# Patient Record
Sex: Male | Born: 1975 | Race: White | Hispanic: No | Marital: Married | Smoking: Former smoker
Health system: Southern US, Community
[De-identification: ages and names within clinical notes are randomized; demographics above are authoritative.]

## PROBLEM LIST (undated history)

## (undated) DIAGNOSIS — T7840XA Allergy, unspecified, initial encounter: Secondary | ICD-10-CM

## (undated) DIAGNOSIS — K219 Gastro-esophageal reflux disease without esophagitis: Secondary | ICD-10-CM

## (undated) DIAGNOSIS — I209 Angina pectoris, unspecified: Secondary | ICD-10-CM

## (undated) DIAGNOSIS — E079 Disorder of thyroid, unspecified: Secondary | ICD-10-CM

## (undated) DIAGNOSIS — E785 Hyperlipidemia, unspecified: Secondary | ICD-10-CM

## (undated) DIAGNOSIS — J302 Other seasonal allergic rhinitis: Secondary | ICD-10-CM

## (undated) DIAGNOSIS — C801 Malignant (primary) neoplasm, unspecified: Secondary | ICD-10-CM

## (undated) DIAGNOSIS — I10 Essential (primary) hypertension: Secondary | ICD-10-CM

## (undated) HISTORY — DX: Hyperlipidemia, unspecified: E78.5

## (undated) HISTORY — DX: Disorder of thyroid, unspecified: E07.9

## (undated) HISTORY — PX: TONSILLECTOMY: SUR1361

## (undated) HISTORY — DX: Allergy, unspecified, initial encounter: T78.40XA

## (undated) HISTORY — DX: Gastro-esophageal reflux disease without esophagitis: K21.9

## (undated) HISTORY — DX: Malignant (primary) neoplasm, unspecified: C80.1

---

## 2010-09-08 ENCOUNTER — Emergency Department (INDEPENDENT_AMBULATORY_CARE_PROVIDER_SITE_OTHER): Payer: 59

## 2010-09-08 ENCOUNTER — Emergency Department (HOSPITAL_BASED_OUTPATIENT_CLINIC_OR_DEPARTMENT_OTHER)
Admission: EM | Admit: 2010-09-08 | Discharge: 2010-09-08 | Disposition: A | Discharge status: Home / Self Care | Payer: 59 | Attending: Emergency Medicine | Admitting: Emergency Medicine

## 2010-09-08 ENCOUNTER — Encounter: Payer: Self-pay | Admitting: *Deleted

## 2010-09-08 DIAGNOSIS — R0789 Other chest pain: Secondary | ICD-10-CM

## 2010-09-08 DIAGNOSIS — X58XXXA Exposure to other specified factors, initial encounter: Secondary | ICD-10-CM | POA: Insufficient documentation

## 2010-09-08 DIAGNOSIS — R0781 Pleurodynia: Secondary | ICD-10-CM

## 2010-09-08 DIAGNOSIS — Y9389 Activity, other specified: Secondary | ICD-10-CM | POA: Insufficient documentation

## 2010-09-08 DIAGNOSIS — R079 Chest pain, unspecified: Secondary | ICD-10-CM | POA: Insufficient documentation

## 2010-09-08 HISTORY — DX: Other seasonal allergic rhinitis: J30.2

## 2010-09-08 MED ORDER — HYDROCODONE-ACETAMINOPHEN 5-500 MG PO TABS: 1.0000 | ORAL_TABLET | Freq: Four times a day (QID) | ORAL | Status: AC | PRN

## 2010-09-08 NOTE — ED Provider Notes (Signed)
History     CSN: [id number] Arrival date & time: 09/08/2010  2:28 PM  Chief Complaint  Patient presents with  . 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 male presenting with chest pain. The history is provided by the patient. No language interpreter was used.  Chest Pain The chest pain began 3 - 5 days ago. Chest pain occurs constantly. The chest pain is unchanged. The pain is associated with lifting. The severity of the pain is moderate. The quality of the pain is described as aching. The pain does not radiate. Pertinent negatives for primary symptoms include no shortness of breath. He tried NSAIDs for the symptoms.     Past Medical History  Diagnosis Date  . Seasonal allergies     History reviewed. No pertinent past surgical history.  History reviewed. No pertinent family history.  History  Substance Use Topics  . Smoking status: Former Games developer  . Smokeless tobacco: Not on file  . Alcohol Use: No      Review of Systems  Respiratory: Negative for shortness of breath.   Cardiovascular: Positive for chest pain.  All other systems reviewed and are negative.    Physical Exam  BP 144/86  Pulse 98  Temp(Src) 98 F (36.7 C) (Oral)  Resp 22  SpO2 99%  Physical Exam  Nursing note and vitals reviewed. Constitutional: He appears well-developed and well-nourished.  Cardiovascular: Normal rate and regular rhythm.   Pulmonary/Chest: Effort normal and breath sounds normal.    Abdominal: Soft.  Musculoskeletal: Normal range of motion.  Neurological: He is alert.  Skin: Skin is warm and dry.  Psychiatric: He has a normal mood and affect.    ED Course  Procedures No results found for this or any previous visit. Dg Chest 2 View  09/08/2010  *RADIOLOGY REPORT*  Clinical Data: Right  upper chest pain.  CHEST - 2 VIEW  Comparison: None.  Findings: Trachea is midline.  Heart size normal.  Lungs are clear. No pleural fluid.  No pneumothorax.  Osseous structures appear grossly intact.  IMPRESSION: No acute findings.  Original Report Authenticated By: Reyes Ivan, M.D.     MDM No acute bony abnormalities noted:will treat symptomatically      Teressa Lower, NP 09/08/10 1615

## 2010-09-08 NOTE — ED Provider Notes (Signed)
Medical screening examination/treatment/procedure(s) were performed by non-physician practitioner and as supervising physician I was immediately available for consultation/collaboration.   Ryley Teater, MD 09/08/10 2330 

## 2010-09-08 NOTE — ED Notes (Signed)
Pt also reports nasal and sinus pressure an d congestion at present time unrelieved by otc zyrtec. Airway patent and intact. No cyanosis noted.

## 2010-09-08 NOTE — ED Notes (Deleted)
Rib pain since knee boarding acc on Sat. States he feels like he has a broken rib. Soreness to his left chest.

## 2010-09-08 NOTE — ED Notes (Signed)
Rib pain since knee boarding acc on Sat. Pain to his right ribs. Feels like he has a broken rib. Hard to take a deep breath.

## 2012-05-01 ENCOUNTER — Ambulatory Visit (INDEPENDENT_AMBULATORY_CARE_PROVIDER_SITE_OTHER): Payer: 59 | Admitting: Emergency Medicine

## 2012-05-01 ENCOUNTER — Ambulatory Visit: Payer: 59

## 2012-05-01 VITALS — BP 142/104 | HR 86 | Temp 98.5°F | Resp 16 | Ht 68.0 in | Wt 226.0 lb

## 2012-05-01 DIAGNOSIS — R079 Chest pain, unspecified: Secondary | ICD-10-CM

## 2012-05-01 DIAGNOSIS — R0789 Other chest pain: Secondary | ICD-10-CM

## 2012-05-01 DIAGNOSIS — R1011 Right upper quadrant pain: Secondary | ICD-10-CM

## 2012-05-01 DIAGNOSIS — R0781 Pleurodynia: Secondary | ICD-10-CM

## 2012-05-01 LAB — POCT CBC
HCT, POC: 48 % (ref 43.5–53.7)
Lymph, poc: 1.1 (ref 0.6–3.4)
MCHC: 33.1 g/dL (ref 31.8–35.4)
MCV: 87.5 fL (ref 80–97)
POC LYMPH PERCENT: 23 %L (ref 10–50)
RDW, POC: 13.1 %
WBC: 5 10*3/uL (ref 4.6–10.2)

## 2012-05-01 NOTE — Patient Instructions (Signed)
Use ibuprofen and/or acetaminophen as needed for pain. Return if your symptoms worsen or persist.

## 2012-05-01 NOTE — Progress Notes (Signed)
Subjective:    Patient ID: Gabriel Hall, male    DOB: 15-Nov-1975, 37 y.o.   MRN: [id number]  HPI This 37 y.o. male presents for evaluation of RIGHT rib and upper abdominal pain.  On 04/27/2012 he was leaning across the windshield of his boat to unsnap the cover, when he slid and landed with his whole weight on the edge of the windshield under the lower edge of his ribs.  It hurt, but he recovered quickly and had only mild discomfort.  He had 0/10 pain at rest, and 5/10 pain with palpation and certain movements.  Then on 04/30/12 at work, he strained to lift a stretcher and had increased pain. His pain is now 4/10 at rest and 8-10/10 with palpation and certain movements.  The intense pains are generally brief, lasting seconds to minutes.   No nausea, HA, dizziness, lightheadedness, vision changes.  No nausea, vomiting, stool changes.  No urinary changes.  No skin changes.  No increased pain with deep breathing, no SOB.  Past medical history, surgical history, family history, social history and problem list reviewed.   Review of Systems As above.    Objective:   Physical Exam Blood pressure 142/104, pulse 86, temperature 98.5 F (36.9 C), resp. rate 16, height 5\' 8"  (1.727 m), weight 226 lb (102.513 kg), SpO2 97.00%. Body mass index is 34.37 kg/(m^2). Well-developed, well nourished WM who is awake, alert and oriented, in NAD. HEENT: [place]/AT, PERRL, EOMI.  Sclera and conjunctiva are clear.  EAC are patent, TMs are normal in appearance. Nasal mucosa is pink and moist. OP is clear. Neck: supple, non-tender, no lymphadenopathy, thyromegaly. Heart: RRR, no murmur Lungs: normal effort, CTA Abdomen: normo-active bowel sounds, supple, no mass or organomegaly. Tenderness of the RIGHT upper quadrant with guarding.  No referred tenderness. RIGHT lateral lower ribs are tender as well.  No skin changes indicative of trauma. No ecchymosis. Extremities: no cyanosis, clubbing or edema. Skin: warm and dry  without rash. Psychologic: good mood and appropriate affect, normal speech and behavior.    Results for orders placed in visit on 05/01/12  POCT CBC      Result Value Range   WBC 5.0  4.6 - 10.2 K/uL   Lymph, poc 1.1  0.6 - 3.4   POC LYMPH PERCENT 23.0  10 - 50 %L   MID (cbc) 0.4  0 - 0.9   POC MID % 7.6  0 - 12 %M   POC Granulocyte 3.5  2 - 6.9   Granulocyte percent 69.4  37 - 80 %G   RBC 5.49  4.69 - 6.13 M/uL   Hemoglobin 15.9  14.1 - 18.1 g/dL   HCT, POC 21.3  08.6 - 53.7 %   MCV 87.5  80 - 97 fL   MCH, POC 29.0  27 - 31.2 pg   MCHC 33.1  31.8 - 35.4 g/dL   RDW, POC 57.8     Platelet Count, POC 280  142 - 424 K/uL   MPV 8.6  0 - 99.8 fL   [facility] reading (PRIMARY) by  Dr. Cleta Alberts.  No acute osseous findings.      Assessment & Plan:  Rib pain on right side - Plan: DG Ribs Unilateral W/Chest Right  Abdominal pain, right upper quadrant - Plan: POCT CBC  Supportive care, rest. Anticipatory guidance.  CT scan not indicated at this time, but if he develops any new symptoms, would proceed.  Fernande Bras, PA-C Physician Assistant-Certified [facility]

## 2012-05-02 ENCOUNTER — Encounter: Payer: Self-pay | Admitting: Physician Assistant

## 2016-01-08 DIAGNOSIS — I10 Essential (primary) hypertension: Secondary | ICD-10-CM | POA: Diagnosis not present

## 2016-01-08 DIAGNOSIS — J069 Acute upper respiratory infection, unspecified: Secondary | ICD-10-CM | POA: Diagnosis not present

## 2016-01-08 DIAGNOSIS — R05 Cough: Secondary | ICD-10-CM | POA: Diagnosis not present

## 2016-04-02 DIAGNOSIS — H10413 Chronic giant papillary conjunctivitis, bilateral: Secondary | ICD-10-CM | POA: Diagnosis not present

## 2016-04-27 DIAGNOSIS — Z0000 Encounter for general adult medical examination without abnormal findings: Secondary | ICD-10-CM | POA: Diagnosis not present

## 2016-05-04 DIAGNOSIS — E784 Other hyperlipidemia: Secondary | ICD-10-CM | POA: Diagnosis not present

## 2016-05-04 DIAGNOSIS — Z0000 Encounter for general adult medical examination without abnormal findings: Secondary | ICD-10-CM | POA: Diagnosis not present

## 2016-05-04 DIAGNOSIS — I10 Essential (primary) hypertension: Secondary | ICD-10-CM | POA: Diagnosis not present

## 2016-05-04 DIAGNOSIS — Z1389 Encounter for screening for other disorder: Secondary | ICD-10-CM | POA: Diagnosis not present

## 2016-07-31 ENCOUNTER — Other Ambulatory Visit: Payer: Self-pay | Admitting: Cardiology

## 2016-07-31 ENCOUNTER — Observation Stay (HOSPITAL_COMMUNITY): Payer: 59

## 2016-07-31 ENCOUNTER — Observation Stay (HOSPITAL_COMMUNITY)
Admission: EM | Admit: 2016-07-31 | Discharge: 2016-07-31 | Disposition: A | Discharge status: Home / Self Care | Payer: 59 | Attending: Cardiovascular Disease | Admitting: Cardiovascular Disease

## 2016-07-31 ENCOUNTER — Emergency Department (HOSPITAL_COMMUNITY): Payer: 59

## 2016-07-31 ENCOUNTER — Encounter (HOSPITAL_COMMUNITY): Payer: Self-pay | Admitting: Emergency Medicine

## 2016-07-31 DIAGNOSIS — R948 Abnormal results of function studies of other organs and systems: Secondary | ICD-10-CM | POA: Insufficient documentation

## 2016-07-31 DIAGNOSIS — I270 Primary pulmonary hypertension: Secondary | ICD-10-CM | POA: Diagnosis not present

## 2016-07-31 DIAGNOSIS — R931 Abnormal findings on diagnostic imaging of heart and coronary circulation: Secondary | ICD-10-CM | POA: Diagnosis present

## 2016-07-31 DIAGNOSIS — I272 Pulmonary hypertension, unspecified: Secondary | ICD-10-CM | POA: Insufficient documentation

## 2016-07-31 DIAGNOSIS — R079 Chest pain, unspecified: Principal | ICD-10-CM

## 2016-07-31 DIAGNOSIS — Z6835 Body mass index (BMI) 35.0-35.9, adult: Secondary | ICD-10-CM | POA: Diagnosis not present

## 2016-07-31 DIAGNOSIS — R072 Precordial pain: Secondary | ICD-10-CM

## 2016-07-31 DIAGNOSIS — E785 Hyperlipidemia, unspecified: Secondary | ICD-10-CM | POA: Diagnosis not present

## 2016-07-31 DIAGNOSIS — Z87891 Personal history of nicotine dependence: Secondary | ICD-10-CM | POA: Diagnosis not present

## 2016-07-31 DIAGNOSIS — I10 Essential (primary) hypertension: Secondary | ICD-10-CM | POA: Diagnosis not present

## 2016-07-31 DIAGNOSIS — Z79899 Other long term (current) drug therapy: Secondary | ICD-10-CM | POA: Insufficient documentation

## 2016-07-31 DIAGNOSIS — E7849 Other hyperlipidemia: Secondary | ICD-10-CM

## 2016-07-31 DIAGNOSIS — E669 Obesity, unspecified: Secondary | ICD-10-CM | POA: Insufficient documentation

## 2016-07-31 DIAGNOSIS — Z7982 Long term (current) use of aspirin: Secondary | ICD-10-CM | POA: Diagnosis not present

## 2016-07-31 DIAGNOSIS — F419 Anxiety disorder, unspecified: Secondary | ICD-10-CM | POA: Insufficient documentation

## 2016-07-31 DIAGNOSIS — R9431 Abnormal electrocardiogram [ECG] [EKG]: Secondary | ICD-10-CM

## 2016-07-31 HISTORY — DX: Essential (primary) hypertension: I10

## 2016-07-31 HISTORY — DX: Hyperlipidemia, unspecified: E78.5

## 2016-07-31 LAB — BASIC METABOLIC PANEL
ANION GAP: 7 (ref 5–15)
BUN: 12 mg/dL (ref 6–20)
CALCIUM: 9.2 mg/dL (ref 8.9–10.3)
CO2: 26 mmol/L (ref 22–32)
Chloride: 106 mmol/L (ref 101–111)
Creatinine, Ser: 1.39 mg/dL — ABNORMAL HIGH (ref 0.61–1.24)
GFR calc Af Amer: >60 mL/min (ref >60)
GFR calc non Af Amer: >60 mL/min (ref >60)
GLUCOSE: 161 mg/dL — AB (ref 65–99)
Potassium: 3.4 mmol/L — ABNORMAL LOW (ref 3.5–5.1)
SODIUM: 139 mmol/L (ref 135–145)

## 2016-07-31 LAB — CBC
HCT: 40.1 % (ref 39.0–52.0)
HEMOGLOBIN: 14 g/dL (ref 13.0–17.0)
MCH: 28.2 pg (ref 26.0–34.0)
MCHC: 34.9 g/dL (ref 30.0–36.0)
MCV: 80.8 fL (ref 78.0–100.0)
Platelets: 206 10*3/uL (ref 150–400)
RBC: 4.96 MIL/uL (ref 4.22–5.81)
RDW: 13.3 % (ref 11.5–15.5)
WBC: 3.4 10*3/uL — AB (ref 4.0–10.5)

## 2016-07-31 LAB — I-STAT TROPONIN, ED
TROPONIN I, POC: 0 ng/mL (ref 0.00–0.08)
TROPONIN I, POC: 0 ng/mL (ref 0.00–0.08)

## 2016-07-31 LAB — LIPID PANEL
Cholesterol: 126 mg/dL (ref 0–200)
HDL: 35 mg/dL — ABNORMAL LOW (ref >40)
LDL Cholesterol: 78 mg/dL (ref 0–99)
Total CHOL/HDL Ratio: 3.6 RATIO
Triglycerides: 65 mg/dL (ref <150)
VLDL: 13 mg/dL (ref 0–40)

## 2016-07-31 LAB — TROPONIN I: Troponin I: <0.03 ng/mL (ref <0.03)

## 2016-07-31 MED ORDER — ATORVASTATIN CALCIUM 10 MG PO TABS: 10.0000 mg | ORAL_TABLET | Freq: Every day | ORAL | Status: DC

## 2016-07-31 MED ORDER — ACETAMINOPHEN 325 MG PO TABS: 650.0000 mg | ORAL_TABLET | Freq: Four times a day (QID) | ORAL | Status: AC | PRN

## 2016-07-31 MED ORDER — TECHNETIUM TC 99M TETROFOSMIN IV KIT
10.0000 | PACK | Freq: Once | INTRAVENOUS | Status: AC | PRN
  Administered 2016-07-31: 10 via INTRAVENOUS

## 2016-07-31 MED ORDER — METOPROLOL TARTRATE 25 MG PO TABS: 12.5000 mg | ORAL_TABLET | Freq: Two times a day (BID) | ORAL | 5 refills | Status: DC

## 2016-07-31 MED ORDER — TECHNETIUM TC 99M TETROFOSMIN IV KIT
30.0000 | PACK | Freq: Once | INTRAVENOUS | Status: AC | PRN
  Administered 2016-07-31: 30 via INTRAVENOUS

## 2016-07-31 MED ORDER — PANTOPRAZOLE SODIUM 40 MG PO TBEC: 40.0000 mg | DELAYED_RELEASE_TABLET | Freq: Every day | ORAL | Status: DC

## 2016-07-31 MED ORDER — ZOLPIDEM TARTRATE 5 MG PO TABS: 5.0000 mg | ORAL_TABLET | Freq: Every evening | ORAL | Status: DC | PRN

## 2016-07-31 MED ORDER — IRBESARTAN 150 MG PO TABS: 150.0000 mg | ORAL_TABLET | Freq: Every day | ORAL | Status: DC

## 2016-07-31 MED ORDER — ONDANSETRON HCL 4 MG/2ML IJ SOLN: 4.0000 mg | Freq: Four times a day (QID) | INTRAMUSCULAR | Status: DC | PRN

## 2016-07-31 MED ORDER — ASPIRIN EC 81 MG PO TBEC: 81.0000 mg | DELAYED_RELEASE_TABLET | Freq: Every day | ORAL | Status: DC

## 2016-07-31 MED ORDER — ALPRAZOLAM 0.25 MG PO TABS: 0.2500 mg | ORAL_TABLET | Freq: Two times a day (BID) | ORAL | Status: DC | PRN

## 2016-07-31 MED ORDER — NITROGLYCERIN 0.4 MG SL SUBL: 0.4000 mg | SUBLINGUAL_TABLET | SUBLINGUAL | Status: DC | PRN

## 2016-07-31 MED ORDER — NITROGLYCERIN 0.4 MG SL SUBL: 0.4000 mg | SUBLINGUAL_TABLET | SUBLINGUAL | 2 refills | Status: DC | PRN

## 2016-07-31 MED ORDER — LORATADINE 10 MG PO TABS: 10.0000 mg | ORAL_TABLET | Freq: Every day | ORAL | Status: DC

## 2016-07-31 MED ORDER — AMLODIPINE BESYLATE 5 MG PO TABS: 5.0000 mg | ORAL_TABLET | Freq: Every day | ORAL | Status: DC

## 2016-07-31 MED ORDER — ACETAMINOPHEN 325 MG PO TABS: 650.0000 mg | ORAL_TABLET | ORAL | Status: DC | PRN

## 2016-07-31 NOTE — ED Provider Notes (Signed)
[facility] DEPT Provider Note   CSN: [id number] Arrival date & time: 07/31/16  4431     History   Chief Complaint Chief Complaint  Patient presents with  . Chest Pain    HPI Gabriel Hall is a 41 y.o. male.  Gabriel Hall is a 41 y.o. Male with history of hypertension and hyperlipidemia who presents to the emergency department complaining of chest pain starting this morning around 4 AM. Patient reports he is working out in the yard yesterday and began having some chest tightness and chest pain which resolved. He reports he woke up this morning around 4 AM and noticed he had some right-sided chest pain that is nonradiating. He tells me he is been just not feeling himself recently. He reports this chest pain will come and go and he last had chest pain about 15 minutes prior to my evaluation. He took aspirin on 4 AM when he woke up this morning. 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 He denies any shortness of breath today. No personal or close family history of MI, DVT or PE. He denies any recent long travel. He denies fevers, coughing, shortness of breath, palpitations, lightheadedness, dizziness, syncope, leg pain, leg swelling, rashes or hemoptysis.   The history is provided by the patient and medical records. No language interpreter was used.  Chest Pain   Pertinent negatives include no abdominal pain, no back pain, no cough, no fever, no headaches, no nausea, no palpitations, no shortness of breath, no vomiting and no weakness.    Past Medical History:  Diagnosis Date  . Dyslipidemia   . HTN (hypertension)   . Seasonal allergies     Patient Active Problem List   Diagnosis Date Noted  . Chest pain with moderate risk of acute coronary syndrome 07/31/2016  . Abnormal EKG 07/31/2016  . Essential hypertension 07/31/2016  . Dyslipidemia 07/31/2016  . Former smoker 07/31/2016    History reviewed.  No pertinent surgical history.     Home Medications    Prior to Admission medications   Medication Sig Start Date End Date Taking? Authorizing Provider  amLODipine (NORVASC) 5 MG tablet Take 5 mg by mouth daily. 05/25/16  Yes [provider]  aspirin 81 MG chewable tablet Chew 324 mg by mouth once.   Yes [provider]  atorvastatin (LIPITOR) 10 MG tablet Take 10 mg by mouth daily. 05/25/16  Yes [provider]  cetirizine (ZYRTEC) 10 MG tablet Take 10 mg by mouth daily.     Yes [provider]  irbesartan (AVAPRO) 150 MG tablet Take 150 mg by mouth daily. 05/15/16  Yes [provider]  omeprazole (PRILOSEC) 40 MG capsule Take 40 mg by mouth daily. 05/12/16  Yes [provider]    Family History Family History  Problem Relation Age of Onset  . Hypertension Mother   . Arthritis Father   . Cancer Father        renal cell carcinoma    Social History Social History  Substance Use Topics  . Smoking status: Former Research scientist (life sciences)  . Smokeless tobacco: Not on file  . Alcohol use No     Allergies   Patient has no known allergies.   Review of Systems Review of Systems  Constitutional: Negative for chills and fever.  HENT: Negative for congestion and sore throat.   Eyes: Negative for visual disturbance.  Respiratory: Negative for cough, shortness of breath and  wheezing.   Cardiovascular: Positive for chest pain. Negative for palpitations and leg swelling.  Gastrointestinal: Negative for abdominal pain, diarrhea, nausea and vomiting.  Genitourinary: Negative for dysuria.  Musculoskeletal: Negative for back pain and neck pain.  Skin: Negative for rash.  Neurological: Negative for syncope, weakness, light-headedness and headaches.     Physical Exam Updated Vital Signs BP 139/87   Pulse 94   Temp 98.5 F (36.9 C) (Oral)   Resp 15   Ht 5\' 10"  (1.778 m)   Wt 111.1 kg (245 lb)   SpO2 94%   BMI 35.15 kg/m   Physical Exam    Constitutional: He appears well-developed and well-nourished. No distress.  Nontoxic-appearing. Overweight male.  HENT:  Head: Normocephalic and atraumatic.  Mouth/Throat: Oropharynx is clear and moist.  Eyes: Pupils are equal, round, and reactive to light. Conjunctivae are normal. Right eye exhibits no discharge. Left eye exhibits no discharge.  Neck: Neck supple. No JVD present.  Cardiovascular: Normal rate, regular rhythm, normal heart sounds and intact distal pulses.  Exam reveals no gallop and no friction rub.   No murmur heard. Bilateral radial, posterior tibialis and dorsalis pedis pulses are intact.    Pulmonary/Chest: Effort normal and breath sounds normal. No stridor. No respiratory distress. He has no wheezes. He has no rales. He exhibits no tenderness.  Lungs are clear to ascultation bilaterally. Symmetric chest expansion bilaterally. No increased work of breathing. No rales or rhonchi.    Abdominal: Soft. There is no tenderness. There is no guarding.  Musculoskeletal: He exhibits no edema or tenderness.  No lower extremity edema or tenderness.  Lymphadenopathy:    He has no cervical adenopathy.  Neurological: He is alert. No sensory deficit. Coordination normal.  Skin: Skin is warm and dry. Capillary refill takes less than 2 seconds. No rash noted. He is not diaphoretic. No erythema. No pallor.  Psychiatric: He has a normal mood and affect. His behavior is normal.  Nursing note and vitals reviewed.    ED Treatments / Results  Labs (all labs ordered are listed, but only abnormal results are displayed) Labs Reviewed  BASIC METABOLIC PANEL - Abnormal; Notable for the following:       Result Value   Potassium 3.4 (*)    Glucose, Bld 161 (*)    Creatinine, Ser 1.39 (*)    All other components within normal limits  CBC - Abnormal; Notable for the following:    WBC 3.4 (*)    All other components within normal limits  HIV ANTIBODY (ROUTINE TESTING)  TROPONIN I  LIPID  PANEL  I-STAT TROPONIN, ED  I-STAT TROPONIN, ED    EKG  EKG Interpretation  Date/Time:  Friday July 31 2016 05:08:27 EDT Ventricular Rate:  98 PR Interval:  152 QRS Duration: 92 QT Interval:  308 QTC Calculation: 393 R Axis:   -6 Text Interpretation:  Normal sinus rhythm Left ventricular hypertrophy ST & T wave abnormality, consider inferolateral ischemia Abnormal ECG No old tracing to compare Confirmed by Jola Schmidt [id number]) on 07/31/2016 7:08:43 AM Also confirmed by Jola Schmidt [phone]), editor Hattie Perch [phone])  on 07/31/2016 8:11:22 AM       Radiology Dg Chest 2 View  Result Date: 07/31/2016 CLINICAL DATA:  41 y/o  M; chest pain. EXAM: CHEST  2 VIEW COMPARISON:  05/01/2012 chest radiograph FINDINGS: The heart size and mediastinal contours are within normal limits. Pulmonary venous hypertension. Both lungs are clear. The visualized skeletal structures are unremarkable. IMPRESSION: Pulmonary venous  hypertension. No consolidation, effusion, or pneumothorax. Stable cardiac silhouette. Electronically Signed   By: Kristine Garbe M.D.   On: 07/31/2016 06:05    Procedures Procedures (including critical care time)  Medications Ordered in ED Medications  aspirin EC tablet 81 mg (not administered)  nitroGLYCERIN (NITROSTAT) SL tablet 0.4 mg (not administered)  acetaminophen (TYLENOL) tablet 650 mg (not administered)  ondansetron (ZOFRAN) injection 4 mg (not administered)  ALPRAZolam (XANAX) tablet 0.25 mg (not administered)  zolpidem (AMBIEN) tablet 5 mg (not administered)  amLODipine (NORVASC) tablet 5 mg (not administered)  atorvastatin (LIPITOR) tablet 10 mg (not administered)  loratadine (CLARITIN) tablet 10 mg (not administered)  irbesartan (AVAPRO) tablet 150 mg (not administered)  pantoprazole (PROTONIX) EC tablet 40 mg (not administered)     Initial Impression / Assessment and Plan / ED Course  I have reviewed the triage vital signs and the nursing  notes.  Pertinent labs & imaging results that were available during my care of the patient were reviewed by me and considered in my medical decision making (see chart for details).    This  is a 41 y.o. Male with history of hypertension and hyperlipidemia who presents to the emergency department complaining of chest pain starting this morning around 4 AM. Patient reports he is working out in the yard yesterday and began having some chest tightness and chest pain which resolved. He reports he woke up this morning around 4 AM and noticed he had some right-sided chest pain that is nonradiating. He tells me he is been just not feeling himself recently. He reports this chest pain will come and go and he last had chest pain about 15 minutes prior to my evaluation. He took aspirin on 4 AM when he woke up this morning. 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 He denies any shortness of breath today. On exam patient is afebrile nontoxic appearing. Lungs are clear auscultation bilaterally. He has some ST and T-wave changes on his EKG. No evidence of STEMI.  Initial troponin is not elevated. Chest x-ray shows pulmonary hypertension. No history of the same.  We'll obtain repeat EKG and second troponin. Plan for cardiology to see a patient very concerned as having acute findings of pulmonary hypertension on his chest x-ray. He also has several risk factors for ACS chills hyperlipidemia, obesity and hypertension. Patient agrees with plan for cardiology to evaluate.   Cardiology came by and saw the patient. They will admit the patient to observation for stress test.  This patient was discussed with Dr. Venora Maples who agrees with assessment and plan.   Final Clinical Impressions(s) / ED Diagnoses   Final diagnoses:  Precordial pain  Essential hypertension  Pulmonary hypertension ([place])  Other hyperlipidemia    New Prescriptions New  Prescriptions   No medications on file     Sharmaine Base 07/31/16 1206    Jola Schmidt, MD 08/01/16 [phone]

## 2016-07-31 NOTE — ED Notes (Signed)
5 min post test, Luke at bedside, pt denies any CP, test ended

## 2016-07-31 NOTE — ED Triage Notes (Signed)
CP at rest starting this morning at 4AM. 3/10. States took Aspirin 324MG  at home PTA. Some wood cutting yesterday and stated he didn't feel right, had some short lived CP at that time No diaphoresis, nausea, shob.

## 2016-07-31 NOTE — ED Notes (Signed)
Spoke with Nuc med and pt to remain NPO until they get him for stress test.

## 2016-07-31 NOTE — ED Notes (Signed)
6 mins, Luke PA at bedside, denies any CP/SOB

## 2016-07-31 NOTE — ED Notes (Signed)
Cardiology at bedside at this time

## 2016-07-31 NOTE — ED Notes (Signed)
Pt transported to Nuc Med. 

## 2016-07-31 NOTE — H&P (Addendum)
Cardiology Admission History and Physical:   Patient ID: Gabriel Hall; MRN: [id number]; DOB: 23-Dec-1975   Admission date: 07/31/2016  Primary Care Provider: Crist Infante, MD Primary Cardiologist: Dr Claiborne Billings- new  Chief Complaint:  Chest pain  Patient Profile:   Gabriel Hall is a 41 y.o. male  Seen in the ED with a history of chest pain.  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 He woke up around 4 am today and noted Rt sided chest pain "ache". It was not exertional and not associated with nausea/ diaphoresis, or SOB. He took an ASA and came to the ED. His pain resolved after about an hour. He also admits to a history of exertional chest "burning" over the past year. His Troponin is negative x 2, EKG has NSST changes.    Past Medical History:  Diagnosis Date  . Dyslipidemia   . HTN (hypertension)   . Seasonal allergies     History reviewed. No pertinent surgical history.   Medications Prior to Admission: Prior to Admission medications   Medication Sig Start Date End Date Taking? Authorizing Provider  amLODipine (NORVASC) 5 MG tablet Take 5 mg by mouth daily. 05/25/16  Yes [provider]  aspirin 81 MG chewable tablet Chew 324 mg by mouth once.   Yes [provider]  atorvastatin (LIPITOR) 10 MG tablet Take 10 mg by mouth daily. 05/25/16  Yes [provider]  cetirizine (ZYRTEC) 10 MG tablet Take 10 mg by mouth daily.     Yes [provider]  irbesartan (AVAPRO) 150 MG tablet Take 150 mg by mouth daily. 05/15/16  Yes [provider]  omeprazole (PRILOSEC) 40 MG capsule Take 40 mg by mouth daily. 05/12/16  Yes [provider]     Allergies:   No Known Allergies  Social History:   Social History   Social History  . Marital status: Married    Spouse name: N/A  . Number of children: N/A  . Years of education: 7   Occupational  History  . [employer] [place] History Main Topics  . Smoking status: Former Research scientist (life sciences)  . Smokeless tobacco: Not on file  . Alcohol use No  . Drug use: No  . Sexual activity: Not on file   Other Topics Concern  . Not on file   Social History Narrative  . No narrative on file    Family History:   The patient's family history includes Arthritis in his father; Cancer in his father; Hypertension in his mother.    ROS:  Please see the history of present illness.  All other ROS reviewed and negative.     Physical Exam/Data:   Vitals:   07/31/16 0930 07/31/16 0945 07/31/16 1000 07/31/16 1015  BP: 120/77 116/70 125/75 115/74  Pulse: 79 69 88 89  Resp: 11 13 15 14   Temp:      TempSrc:      SpO2: 93% 96% 93% 93%  Weight:      Height:       No intake or output data in the 24 hours ending 07/31/16 1102 Filed Weights   07/31/16 0519  Weight: 245 lb (111.1 kg)   Body mass index is 35.15 kg/m.  General:  Well nourished, well developed, in no acute distress HEENT: normal Lymph: no adenopathy Neck: no JVD Endocrine:  No thryomegaly Vascular: No carotid bruits; FA pulses 2+  bilaterally without bruits  Cardiac:  normal S1, S2; RRR; no murmur  Lungs:  clear to auscultation bilaterally, no wheezing, rhonchi or rales  Abd: soft, nontender, no hepatomegaly  Ext: no edema Musculoskeletal:  No deformities, BUE and BLE strength normal and equal Skin: warm and dry  Neuro:  CNs 2-12 intact, no focal abnormalities noted Psych:  Normal affect    EKG:  The ECG that was done 07/31/16 was personally reviewed and demonstrates NSR with TWI and slight ST depression 2, AVF, V3-V6  Laboratory Data:  Chemistry Recent Labs Lab 07/31/16 0522  NA 139  K 3.4*  CL 106  CO2 26  GLUCOSE 161*  BUN 12  CREATININE 1.39*  CALCIUM 9.2  GFRNONAA >60  GFRAA >60  ANIONGAP 7    No results for input(s): PROT, ALBUMIN, AST, ALT, ALKPHOS, BILITOT in the last 168 hours. Hematology Recent  Labs Lab 07/31/16 0522  WBC 3.4*  RBC 4.96  HGB 14.0  HCT 40.1  MCV 80.8  MCH 28.2  MCHC 34.9  RDW 13.3  PLT 206   Cardiac EnzymesNo results for input(s): TROPONINI in the last 168 hours.  Recent Labs Lab 07/31/16 0533 07/31/16 0917  TROPIPOC 0.00 0.00    BNPNo results for input(s): BNP, PROBNP in the last 168 hours.  DDimer No results for input(s): DDIMER in the last 168 hours.  Lipid Panel  No results found for: CHOL, TRIG, HDL, CHOLHDL, VLDL, LDLCALC, LDLDIRECT  Radiology/Studies:  Dg Chest 2 View  Result Date: 07/31/2016 CLINICAL DATA:  41 y/o  M; chest pain. EXAM: CHEST  2 VIEW COMPARISON:  05/01/2012 chest radiograph FINDINGS: The heart size and mediastinal contours are within normal limits. Pulmonary venous hypertension. Both lungs are clear. The visualized skeletal structures are unremarkable. IMPRESSION: Pulmonary venous hypertension. No consolidation, effusion, or pneumothorax. Stable cardiac silhouette. Electronically Signed   By: Kristine Garbe M.D.   On: 07/31/2016 06:05    Assessment and Plan:    Chest pain with a moderate risk for ACS- The chest pain that brought him to the ED is atypical but he gives a history of exertional "burning" for the past year  Abnormal EKG- NSST changes with TWI lead 2, V3-V6  Essential HTN- Controlled  Dyslipidemia Low HDL-on loiw dose statin  Plan: Discussed with Dr Claiborne Billings will arrange for GXT Myoview today- pt has been NPO   Severity of Illness: The appropriate patient status for this patient is OBSERVATION. Observation status is judged to be reasonable and necessary in order to provide the required intensity of service to ensure the patient's safety. The patient's presenting symptoms, physical exam findings, and initial radiographic and laboratory data in the context of their medical condition is felt to place them at decreased risk for further clinical deterioration. Furthermore, it is anticipated that the  patient will be medically stable for discharge from the hospital within 2 midnights of admission. The following factors support the patient status of observation.   " The patient's presenting symptoms include chest pain. " The physical exam findings include -normal " The initial radiographic and laboratory data are abnormal EKG     Signed, Kerin Ransom, PA-C  07/31/2016 11:02 AM    Patient seen and examined. Agree with assessment and plan.  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  He  states he has been told of having mild ST changes on his ECG.  For the past 15 years.  Last evening, he noticed little more fatigue after work when he was trying to cut branches of a tree near his house.  This morning he was awakened from sleep with right-sided sternal chest pain which initially was described as sharp.  He became more anxious as the chest pain persisted, took aspirin therapy and presented to the emergency room for further evaluation.  Upon further questioning he also has noticed some vague symptoms of exertional chest burning over the past year with significant activity.  Presently, he is pain-free.  Hemodynamics are stable.  His pulse is around 90.  He is mesomorphic in appearance, and BMI is 35.15, consistent with moderate obesity.  He has a thick neck.  HEENT is otherwise unremarkable.  Lungs are clear.  He did not have chest wall tenderness to palpation.  Rhythm was regular with a faint 1/6 systolic murmur.  He had moderate central adiposity.  Pulses were 2+.  There was no muscle tenderness.  There is no clubbing, cyanosis or edema.  Neurologic exam is nonfocal.  He had normal affect and mood.  His ECG reveals normal sinus rhythm at 87 bpm.  There is borderline LVH by voltage criteria in aVL.  There are  nonspecific ST changes in leads 1, V5 and V6, as well as II, III, and F.  Initial laboratories notable for potassium of 3.4.  BUN 12, creatinine 1.39.  Hemoglobin 14./Hematocrit 40.1.  Troponin 02.  At present, will admit for observation.  The patient admits to having significant anxiety over this chest discomfort.  He has baseline ST-T changes.  For this reason, a routine treadmill test will have reduced sensitivity and specificity.  We will try to arrange for him to have a exercise Myoview study this afternoon this can be arranged.  Alternatively , CT angiography can be performed for initial assessment.    Troy Sine, MD, [facility] 07/31/2016 11:22 AM

## 2016-07-31 NOTE — ED Notes (Signed)
3 min post test Lurena Joiner PA at bedside, pt denies any CP

## 2016-07-31 NOTE — Discharge Instructions (Signed)
Nothing to eat or drink after midnight 08/05/16 Report to short stay 12:30 on 08/05/16

## 2016-07-31 NOTE — ED Notes (Signed)
Pt being discharged from Cardiology and ED is printing the AVS papers from Dr Kerin Ransom.

## 2016-07-31 NOTE — ED Notes (Signed)
Pre procedure VS lying down

## 2016-07-31 NOTE — ED Notes (Signed)
6 mins, Luke PA at bedside. Stress test ended

## 2016-07-31 NOTE — ED Notes (Signed)
3 mins, Luke PA at bedside

## 2016-07-31 NOTE — Discharge Summary (Signed)
Patient ID: Gabriel Hall,  MRN: [id number], DOB/AGE: December 14, 1975 41 y.o.  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 He woke up around 4 am today and noted Rt sided chest pain "ache". It was not exertional and not associated with nausea/ diaphoresis, or SOB. He took an ASA and came to the ED. His pain resolved after about an hour. He also admits to a history of exertional chest "burning" over the past year. His Troponin is negative x 2, EKG has NSST changes. The pt underwent treadmill Myoview testing 07/31/16. He went 8:30 on the treadmill without chest pain but his images w abnormal. This was reviewed by Dr Claiborne Billings who feels the pt should have a diagnostic cath but that this could be done as an OP. We discussed this with the pt and his wife and they are in agreement. Plan is to have an OP cath [facility] 08/05/16. We added low dose beta blocker and prn NTG at discharge.   Discharge Vitals:  Blood pressure 127/69, pulse 97, temperature 98.5 F (36.9 C), temperature source Oral, resp. rate 13, height 5\' 10"  (1.778 m), weight 245 lb (111.1 kg), SpO2 97 %.    Labs: Results for orders placed or performed during the hospital encounter of 07/31/16 (from the past 24 hour(s))  Basic metabolic panel     Status: Abnormal   Collection Time: 07/31/16  5:22 AM  Result Value Ref Range   Sodium 139 135 - 145 mmol/L   Potassium 3.4 (L) 3.5 - 5.1 mmol/L   Chloride 106 101 - 111 mmol/L   CO2 26 22 - 32 mmol/L   Glucose, Bld 161 (H) 65 - 99 mg/dL   BUN 12 6 - 20 mg/dL   Creatinine, Ser 1.39 (H) 0.61 - 1.24 mg/dL   Calcium 9.2 8.9 - 10.3 mg/dL   GFR calc non Af Amer >60 >60 mL/min   GFR calc Af Amer >60 >60 mL/min   Anion gap 7 5 - 15  CBC     Status: Abnormal   Collection Time: 07/31/16  5:22 AM  Result Value Ref Range   WBC 3.4 (L) 4.0 - 10.5 K/uL   RBC 4.96 4.22 - 5.81 MIL/uL   Hemoglobin 14.0 13.0 - 17.0 g/dL   HCT 40.1 39.0 - 52.0 %   MCV 80.8 78.0 - 100.0 fL   MCH 28.2 26.0 - 34.0 pg   MCHC 34.9 30.0 - 36.0 g/dL   RDW 13.3 11.5 - 15.5 %   Platelets 206 150 - 400 K/uL  I-stat troponin, ED     Status: None   Collection Time: 07/31/16  5:33 AM  Result Value Ref Range   Troponin i, poc 0.00 0.00 - 0.08 ng/mL   Comment 3          I-stat troponin, ED     Status: None   Collection Time: 07/31/16  9:17 AM  Result Value Ref Range   Troponin i, poc 0.00 0.00 - 0.08 ng/mL   Comment 3          Troponin I  Status: None   Collection Time: 07/31/16  4:14 PM  Result Value Ref Range   Troponin I <0.03 <0.03 ng/mL  Lipid panel     Status: Abnormal   Collection Time: 07/31/16  4:14 PM  Result Value Ref Range   Cholesterol 126 0 - 200 mg/dL   Triglycerides 65 <150 mg/dL   HDL 35 (L) >40 mg/dL   Total CHOL/HDL Ratio 3.6 RATIO   VLDL 13 0 - 40 mg/dL   LDL Cholesterol 78 0 - 99 mg/dL    Disposition:  Follow-up Information    Troy Sine, MD Follow up on 08/05/2016.   Specialty:  Cardiology Why:  report to short stay at 12:30 pm for cath Contact information: [address] [place] [address] [phone]           Discharge Medications:  Allergies as of 07/31/2016   No Known Allergies     Medication List    TAKE these medications   acetaminophen 325 MG tablet Commonly known as:  TYLENOL Take 2 tablets (650 mg total) by mouth every 6 (six) hours as needed for mild pain or headache.   amLODipine 5 MG tablet Commonly known as:  NORVASC Take 5 mg by mouth daily.   aspirin 81 MG chewable tablet Chew 324 mg by  mouth once.   atorvastatin 10 MG tablet Commonly known as:  LIPITOR Take 10 mg by mouth daily.   cetirizine 10 MG tablet Commonly known as:  ZYRTEC Take 10 mg by mouth daily.   irbesartan 150 MG tablet Commonly known as:  AVAPRO Take 150 mg by mouth daily.   metoprolol tartrate 25 MG tablet Commonly known as:  LOPRESSOR Take 0.5 tablets (12.5 mg total) by mouth 2 (two) times daily.   nitroGLYCERIN 0.4 MG SL tablet Commonly known as:  NITROSTAT Place 1 tablet (0.4 mg total) under the tongue every 5 (five) minutes x 3 doses as needed for chest pain.   omeprazole 40 MG capsule Commonly known as:  PRILOSEC Take 40 mg by mouth daily.        Duration of Discharge Encounter: Greater than 30 minutes including physician time.  Angelena Form PA-C 07/31/2016 5:26 PM

## 2016-07-31 NOTE — ED Notes (Signed)
1 min post stress test Lurena Joiner PA at bedside

## 2016-08-01 LAB — HIV ANTIBODY (ROUTINE TESTING W REFLEX): HIV Screen 4th Generation wRfx: NONREACTIVE

## 2016-08-03 ENCOUNTER — Telehealth: Payer: Self-pay

## 2016-08-03 NOTE — Telephone Encounter (Signed)
Patient contacted pre-catheterization at [facility] scheduled for:  08/05/2016 @ 1430 Verified arrival time and place:  [facility] @ 1230 - gave directions to admissions Confirmed AM meds to be taken pre-cath with sip of water: Notified to take ASA prior to arrival Confirmed patient has responsible person to drive home post procedure and observe patient for 24 hours:  Yes  Addl concerns:  Pt was discharged from [facility] with with OP scheduled cath.  Gave Pt cath instructions.  Notified Pt could have light clear breakfast prior to 8 am.  Notified NPO after that.  Pt has labwork from recent hospitalization.  Provided Pt Cr results.  Encouraged Pt to drink lots of water.  Discussed Pt stress test.  All Pt questions answered.  Notified Pt to call office if any further questions.  Number to [facility] office given.

## 2016-08-05 ENCOUNTER — Ambulatory Visit (HOSPITAL_COMMUNITY)
Admission: RE | Admit: 2016-08-05 | Discharge: 2016-08-05 | Disposition: A | Discharge status: Home / Self Care | Payer: 59 | Source: Ambulatory Visit | Attending: Cardiovascular Disease | Admitting: Cardiovascular Disease

## 2016-08-05 ENCOUNTER — Encounter (HOSPITAL_COMMUNITY)
Admission: RE | Disposition: A | Discharge status: Home / Self Care | Payer: Self-pay | Source: Ambulatory Visit | Attending: Cardiovascular Disease

## 2016-08-05 DIAGNOSIS — R079 Chest pain, unspecified: Secondary | ICD-10-CM | POA: Diagnosis not present

## 2016-08-05 DIAGNOSIS — R9439 Abnormal result of other cardiovascular function study: Secondary | ICD-10-CM

## 2016-08-05 DIAGNOSIS — E785 Hyperlipidemia, unspecified: Secondary | ICD-10-CM | POA: Diagnosis not present

## 2016-08-05 DIAGNOSIS — E669 Obesity, unspecified: Secondary | ICD-10-CM | POA: Diagnosis not present

## 2016-08-05 DIAGNOSIS — I10 Essential (primary) hypertension: Secondary | ICD-10-CM | POA: Diagnosis not present

## 2016-08-05 DIAGNOSIS — Z7982 Long term (current) use of aspirin: Secondary | ICD-10-CM | POA: Insufficient documentation

## 2016-08-05 DIAGNOSIS — Z6835 Body mass index (BMI) 35.0-35.9, adult: Secondary | ICD-10-CM | POA: Insufficient documentation

## 2016-08-05 DIAGNOSIS — Z87891 Personal history of nicotine dependence: Secondary | ICD-10-CM | POA: Insufficient documentation

## 2016-08-05 HISTORY — PX: LEFT HEART CATH AND CORONARY ANGIOGRAPHY: CATH118249

## 2016-08-05 LAB — BASIC METABOLIC PANEL
ANION GAP: 10 (ref 5–15)
ANION GAP: 7 (ref 5–15)
BUN: 12 mg/dL (ref 6–20)
BUN: 14 mg/dL (ref 6–20)
CALCIUM: 9.4 mg/dL (ref 8.9–10.3)
CHLORIDE: 110 mmol/L (ref 101–111)
CO2: 20 mmol/L — AB (ref 22–32)
CO2: 26 mmol/L (ref 22–32)
CREATININE: 1.01 mg/dL (ref 0.61–1.24)
Calcium: 9.3 mg/dL (ref 8.9–10.3)
Chloride: 106 mmol/L (ref 101–111)
Creatinine, Ser: 1.03 mg/dL (ref 0.61–1.24)
GFR calc non Af Amer: >60 mL/min (ref >60)
GLUCOSE: 92 mg/dL (ref 65–99)
Glucose, Bld: 95 mg/dL (ref 65–99)
POTASSIUM: 4.6 mmol/L (ref 3.5–5.1)
Potassium: 4.3 mmol/L (ref 3.5–5.1)
Sodium: 139 mmol/L (ref 135–145)
Sodium: 140 mmol/L (ref 135–145)

## 2016-08-05 LAB — PROTIME-INR
INR: 0.96
PROTHROMBIN TIME: 12.8 s (ref 11.4–15.2)

## 2016-08-05 SURGERY — LEFT HEART CATH AND CORONARY ANGIOGRAPHY
Anesthesia: LOCAL

## 2016-08-05 MED ORDER — SODIUM CHLORIDE 0.9 % IV SOLN: INTRAVENOUS | Status: DC

## 2016-08-05 MED ORDER — NITROGLYCERIN 1 MG/10 ML FOR IR/CATH LAB
INTRA_ARTERIAL | Status: DC | PRN
  Administered 2016-08-05: 100 ug via INTRA_ARTERIAL

## 2016-08-05 MED ORDER — FENTANYL CITRATE (PF) 100 MCG/2ML IJ SOLN
INTRAMUSCULAR | Status: DC | PRN
  Administered 2016-08-05: 50 ug via INTRAVENOUS
  Administered 2016-08-05 (×2): 25 ug via INTRAVENOUS

## 2016-08-05 MED ORDER — HEPARIN (PORCINE) IN NACL 2-0.9 UNIT/ML-% IJ SOLN
INTRAMUSCULAR | Status: AC
  Filled 2016-08-05: qty 1000

## 2016-08-05 MED ORDER — SODIUM CHLORIDE 0.9% FLUSH: 3.0000 mL | Freq: Two times a day (BID) | INTRAVENOUS | Status: DC

## 2016-08-05 MED ORDER — HEPARIN SODIUM (PORCINE) 1000 UNIT/ML IJ SOLN
INTRAMUSCULAR | Status: AC
  Filled 2016-08-05: qty 1

## 2016-08-05 MED ORDER — MIDAZOLAM HCL 2 MG/2ML IJ SOLN
INTRAMUSCULAR | Status: AC
  Filled 2016-08-05: qty 2

## 2016-08-05 MED ORDER — LIDOCAINE HCL (PF) 1 % IJ SOLN
INTRAMUSCULAR | Status: DC | PRN
  Administered 2016-08-05 (×2): 2 mL

## 2016-08-05 MED ORDER — ACETAMINOPHEN 325 MG PO TABS
650.0000 mg | ORAL_TABLET | ORAL | Status: DC | PRN
Start: 2016-08-05 — End: 2016-08-05

## 2016-08-05 MED ORDER — SODIUM CHLORIDE 0.9 % IV SOLN: 250.0000 mL | INTRAVENOUS | Status: DC | PRN

## 2016-08-05 MED ORDER — SODIUM CHLORIDE 0.9% FLUSH: 3.0000 mL | INTRAVENOUS | Status: DC | PRN

## 2016-08-05 MED ORDER — IOPAMIDOL (ISOVUE-370) INJECTION 76%
INTRAVENOUS | Status: AC
  Filled 2016-08-05: qty 100

## 2016-08-05 MED ORDER — ONDANSETRON HCL 4 MG/2ML IJ SOLN: 4.0000 mg | Freq: Four times a day (QID) | INTRAMUSCULAR | Status: DC | PRN

## 2016-08-05 MED ORDER — HEPARIN SODIUM (PORCINE) 1000 UNIT/ML IJ SOLN
INTRAMUSCULAR | Status: DC | PRN
  Administered 2016-08-05: 5000 [IU] via INTRAVENOUS

## 2016-08-05 MED ORDER — IOPAMIDOL (ISOVUE-370) INJECTION 76%
INTRAVENOUS | Status: AC
  Filled 2016-08-05: qty 50

## 2016-08-05 MED ORDER — ASPIRIN 81 MG PO CHEW: 81.0000 mg | CHEWABLE_TABLET | Freq: Every day | ORAL | Status: DC

## 2016-08-05 MED ORDER — NITROGLYCERIN 1 MG/10 ML FOR IR/CATH LAB
INTRA_ARTERIAL | Status: AC
  Filled 2016-08-05: qty 10

## 2016-08-05 MED ORDER — SODIUM CHLORIDE 0.9 % WEIGHT BASED INFUSION
3.0000 mL/kg/h | INTRAVENOUS | Status: DC
  Administered 2016-08-05: 3 mL/kg/h via INTRAVENOUS

## 2016-08-05 MED ORDER — MIDAZOLAM HCL 2 MG/2ML IJ SOLN
INTRAMUSCULAR | Status: DC | PRN
  Administered 2016-08-05 (×2): 1 mg via INTRAVENOUS
  Administered 2016-08-05: 2 mg via INTRAVENOUS

## 2016-08-05 MED ORDER — SODIUM CHLORIDE 0.9 % WEIGHT BASED INFUSION: 1.0000 mL/kg/h | INTRAVENOUS | Status: DC

## 2016-08-05 MED ORDER — VERAPAMIL HCL 2.5 MG/ML IV SOLN
INTRAVENOUS | Status: AC
  Filled 2016-08-05: qty 2

## 2016-08-05 MED ORDER — IOPAMIDOL (ISOVUE-370) INJECTION 76%
INTRAVENOUS | Status: DC | PRN
  Administered 2016-08-05: 130 mL via INTRA_ARTERIAL

## 2016-08-05 MED ORDER — FENTANYL CITRATE (PF) 100 MCG/2ML IJ SOLN
INTRAMUSCULAR | Status: AC
  Filled 2016-08-05: qty 2

## 2016-08-05 MED ORDER — VERAPAMIL HCL 2.5 MG/ML IV SOLN
INTRAVENOUS | Status: DC | PRN
  Administered 2016-08-05: 10 mL via INTRA_ARTERIAL

## 2016-08-05 MED ORDER — HEPARIN (PORCINE) IN NACL 2-0.9 UNIT/ML-% IJ SOLN
INTRAMUSCULAR | Status: AC | PRN
  Administered 2016-08-05: 1000 mL

## 2016-08-05 MED ORDER — LIDOCAINE HCL (PF) 1 % IJ SOLN
INTRAMUSCULAR | Status: AC
  Filled 2016-08-05: qty 30

## 2016-08-05 SURGICAL SUPPLY — 19 items
CATH INFINITI 5 FR 3DRC (CATHETERS) ×2 IMPLANT
CATH INFINITI 5 FR JL3.5 (CATHETERS) ×2 IMPLANT
CATH INFINITI 5FR ANG PIGTAIL (CATHETERS) ×2 IMPLANT
CATH INFINITI JR4 5F (CATHETERS) ×2 IMPLANT
CATH LAUNCHER 5F RADL (CATHETERS) ×1 IMPLANT
CATH OPTITORQUE TIG 4.0 5F (CATHETERS) ×2 IMPLANT
CATHETER LAUNCHER 5F RADL (CATHETERS) ×2
COVER PRB 48X5XTLSCP FOLD TPE (BAG) ×1 IMPLANT
COVER PROBE 5X48 (BAG) ×1
DEVICE RAD COMP TR BAND LRG (VASCULAR PRODUCTS) ×2 IMPLANT
GLIDESHEATH SLEND SS 6F .021 (SHEATH) ×2 IMPLANT
GUIDEWIRE INQWIRE 1.5J.035X260 (WIRE) ×1 IMPLANT
INQWIRE 1.5J .035X260CM (WIRE) ×2
KIT HEART LEFT (KITS) ×2 IMPLANT
PACK CARDIAC CATHETERIZATION (CUSTOM PROCEDURE TRAY) ×2 IMPLANT
SYR MEDRAD MARK V 150ML (SYRINGE) ×2 IMPLANT
TRANSDUCER W/STOPCOCK (MISCELLANEOUS) ×2 IMPLANT
TUBING CIL FLEX 10 FLL-RA (TUBING) ×2 IMPLANT
WIRE HI TORQ VERSACORE-J 145CM (WIRE) ×2 IMPLANT

## 2016-08-05 NOTE — Interval H&P Note (Signed)
Cath Lab Visit (complete for each Cath Lab visit)  Clinical Evaluation Leading to the Procedure:   ACS: No.  Non-ACS:    Anginal Classification: CCS II  Anti-ischemic medical therapy: Maximal Therapy (2 or more classes of medications)  Non-Invasive Test Results: Intermediate-risk stress test findings: cardiac mortality 1-3%/year  Prior CABG: No previous CABG      History and Physical Interval Note:  08/05/2016 4:28 PM  Gabriel Hall  has presented today for surgery, with the diagnosis of cp, abnormal myoview  The various methods of treatment have been discussed with the patient and family. After consideration of risks, benefits and other options for treatment, the patient has consented to  Procedure(s): Left Heart Cath and Coronary Angiography (N/A) as a surgical intervention .  The patient's history has been reviewed, patient examined, no change in status, stable for surgery.  I have reviewed the patient's chart and labs.  Questions were answered to the patient's satisfaction.     Shelva Majestic

## 2016-08-05 NOTE — Discharge Instructions (Signed)

## 2016-08-05 NOTE — H&P (View-Only) (Signed)
Cardiology Admission History and Physical:   Patient ID: Gabriel Hall; MRN: [id number]; DOB: 1975-07-26   Admission date: 07/31/2016  Primary Care Provider: Crist Infante, MD Primary Cardiologist: Dr Claiborne Billings- new  Chief Complaint:  Chest pain  Patient Profile:   Gabriel Hall is a 41 y.o. male  Seen in the ED with a history of chest pain.  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 He woke up around 4 am today and noted Rt sided chest pain "ache". It was not exertional and not associated with nausea/ diaphoresis, or SOB. He took an ASA and came to the ED. His pain resolved after about an hour. He also admits to a history of exertional chest "burning" over the past year. His Troponin is negative x 2, EKG has NSST changes.    Past Medical History:  Diagnosis Date  . Dyslipidemia   . HTN (hypertension)   . Seasonal allergies     History reviewed. No pertinent surgical history.   Medications Prior to Admission: Prior to Admission medications   Medication Sig Start Date End Date Taking? Authorizing Provider  amLODipine (NORVASC) 5 MG tablet Take 5 mg by mouth daily. 05/25/16  Yes [provider]  aspirin 81 MG chewable tablet Chew 324 mg by mouth once.   Yes [provider]  atorvastatin (LIPITOR) 10 MG tablet Take 10 mg by mouth daily. 05/25/16  Yes [provider]  cetirizine (ZYRTEC) 10 MG tablet Take 10 mg by mouth daily.     Yes [provider]  irbesartan (AVAPRO) 150 MG tablet Take 150 mg by mouth daily. 05/15/16  Yes [provider]  omeprazole (PRILOSEC) 40 MG capsule Take 40 mg by mouth daily. 05/12/16  Yes [provider]     Allergies:   No Known Allergies  Social History:   Social History   Social History  . Marital status: Married    Spouse name: N/A  . Number of children: N/A  . Years of education: 50   Occupational  History  . [employer] [place] History Main Topics  . Smoking status: Former Research scientist (life sciences)  . Smokeless tobacco: Not on file  . Alcohol use No  . Drug use: No  . Sexual activity: Not on file   Other Topics Concern  . Not on file   Social History Narrative  . No narrative on file    Family History:   The patient's family history includes Arthritis in his father; Cancer in his father; Hypertension in his mother.    ROS:  Please see the history of present illness.  All other ROS reviewed and negative.     Physical Exam/Data:   Vitals:   07/31/16 0930 07/31/16 0945 07/31/16 1000 07/31/16 1015  BP: 120/77 116/70 125/75 115/74  Pulse: 79 69 88 89  Resp: 11 13 15 14   Temp:      TempSrc:      SpO2: 93% 96% 93% 93%  Weight:      Height:       No intake or output data in the 24 hours ending 07/31/16 1102 Filed Weights   07/31/16 0519  Weight: 245 lb (111.1 kg)   Body mass index is 35.15 kg/m.  General:  Well nourished, well developed, in no acute distress HEENT: normal Lymph: no adenopathy Neck: no JVD Endocrine:  No thryomegaly Vascular: No carotid bruits; FA pulses 2+  bilaterally without bruits  Cardiac:  normal S1, S2; RRR; no murmur  Lungs:  clear to auscultation bilaterally, no wheezing, rhonchi or rales  Abd: soft, nontender, no hepatomegaly  Ext: no edema Musculoskeletal:  No deformities, BUE and BLE strength normal and equal Skin: warm and dry  Neuro:  CNs 2-12 intact, no focal abnormalities noted Psych:  Normal affect    EKG:  The ECG that was done 07/31/16 was personally reviewed and demonstrates NSR with TWI and slight ST depression 2, AVF, V3-V6  Laboratory Data:  Chemistry Recent Labs Lab 07/31/16 0522  NA 139  K 3.4*  CL 106  CO2 26  GLUCOSE 161*  BUN 12  CREATININE 1.39*  CALCIUM 9.2  GFRNONAA >60  GFRAA >60  ANIONGAP 7    No results for input(s): PROT, ALBUMIN, AST, ALT, ALKPHOS, BILITOT in the last 168 hours. Hematology Recent  Labs Lab 07/31/16 0522  WBC 3.4*  RBC 4.96  HGB 14.0  HCT 40.1  MCV 80.8  MCH 28.2  MCHC 34.9  RDW 13.3  PLT 206   Cardiac EnzymesNo results for input(s): TROPONINI in the last 168 hours.  Recent Labs Lab 07/31/16 0533 07/31/16 0917  TROPIPOC 0.00 0.00    BNPNo results for input(s): BNP, PROBNP in the last 168 hours.  DDimer No results for input(s): DDIMER in the last 168 hours.  Lipid Panel  No results found for: CHOL, TRIG, HDL, CHOLHDL, VLDL, LDLCALC, LDLDIRECT  Radiology/Studies:  Dg Chest 2 View  Result Date: 07/31/2016 CLINICAL DATA:  41 y/o  M; chest pain. EXAM: CHEST  2 VIEW COMPARISON:  05/01/2012 chest radiograph FINDINGS: The heart size and mediastinal contours are within normal limits. Pulmonary venous hypertension. Both lungs are clear. The visualized skeletal structures are unremarkable. IMPRESSION: Pulmonary venous hypertension. No consolidation, effusion, or pneumothorax. Stable cardiac silhouette. Electronically Signed   By: Kristine Garbe M.D.   On: 07/31/2016 06:05    Assessment and Plan:    Chest pain with a moderate risk for ACS- The chest pain that brought him to the ED is atypical but he gives a history of exertional "burning" for the past year  Abnormal EKG- NSST changes with TWI lead 2, V3-V6  Essential HTN- Controlled  Dyslipidemia Low HDL-on loiw dose statin  Plan: Discussed with Dr Claiborne Billings will arrange for GXT Myoview today- pt has been NPO   Severity of Illness: The appropriate patient status for this patient is OBSERVATION. Observation status is judged to be reasonable and necessary in order to provide the required intensity of service to ensure the patient's safety. The patient's presenting symptoms, physical exam findings, and initial radiographic and laboratory data in the context of their medical condition is felt to place them at decreased risk for further clinical deterioration. Furthermore, it is anticipated that the  patient will be medically stable for discharge from the hospital within 2 midnights of admission. The following factors support the patient status of observation.   " The patient's presenting symptoms include chest pain. " The physical exam findings include -normal " The initial radiographic and laboratory data are abnormal EKG     Signed, Kerin Ransom, PA-C  07/31/2016 11:02 AM    Patient seen and examined. Agree with assessment and plan.  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  He  states he has been told of having mild ST changes on his ECG.  For the past 15 years.  Last evening, he noticed little more fatigue after work when he was trying to cut branches of a tree near his house.  This morning he was awakened from sleep with right-sided sternal chest pain which initially was described as sharp.  He became more anxious as the chest pain persisted, took aspirin therapy and presented to the emergency room for further evaluation.  Upon further questioning he also has noticed some vague symptoms of exertional chest burning over the past year with significant activity.  Presently, he is pain-free.  Hemodynamics are stable.  His pulse is around 90.  He is mesomorphic in appearance, and BMI is 35.15, consistent with moderate obesity.  He has a thick neck.  HEENT is otherwise unremarkable.  Lungs are clear.  He did not have chest wall tenderness to palpation.  Rhythm was regular with a faint 1/6 systolic murmur.  He had moderate central adiposity.  Pulses were 2+.  There was no muscle tenderness.  There is no clubbing, cyanosis or edema.  Neurologic exam is nonfocal.  He had normal affect and mood.  His ECG reveals normal sinus rhythm at 87 bpm.  There is borderline LVH by voltage criteria in aVL.  There are  nonspecific ST changes in leads 1, V5 and V6, as well as II, III, and F.  Initial laboratories notable for potassium of 3.4.  BUN 12, creatinine 1.39.  Hemoglobin 14./Hematocrit 40.1.  Troponin 02.  At present, will admit for observation.  The patient admits to having significant anxiety over this chest discomfort.  He has baseline ST-T changes.  For this reason, a routine treadmill test will have reduced sensitivity and specificity.  We will try to arrange for him to have a exercise Myoview study this afternoon this can be arranged.  Alternatively , CT angiography can be performed for initial assessment.    Troy Sine, MD, [facility] 07/31/2016 11:22 AM

## 2016-08-06 ENCOUNTER — Encounter (HOSPITAL_COMMUNITY): Payer: Self-pay | Admitting: Cardiovascular Disease

## 2016-08-12 ENCOUNTER — Telehealth: Payer: Self-pay | Admitting: Cardiovascular Disease

## 2016-08-12 NOTE — Telephone Encounter (Signed)
Called patient and LVM to call back and schedule his hospital followup.

## 2016-08-18 ENCOUNTER — Encounter: Payer: Self-pay | Admitting: Physician Assistant

## 2016-08-18 ENCOUNTER — Ambulatory Visit (INDEPENDENT_AMBULATORY_CARE_PROVIDER_SITE_OTHER): Payer: 59 | Admitting: Physician Assistant

## 2016-08-18 VITALS — BP 140/89 | HR 66 | Ht 68.0 in | Wt 249.4 lb

## 2016-08-18 DIAGNOSIS — R079 Chest pain, unspecified: Secondary | ICD-10-CM | POA: Diagnosis not present

## 2016-08-18 DIAGNOSIS — E785 Hyperlipidemia, unspecified: Secondary | ICD-10-CM

## 2016-08-18 DIAGNOSIS — I10 Essential (primary) hypertension: Secondary | ICD-10-CM

## 2016-08-18 NOTE — Patient Instructions (Signed)
Medication Instructions: Your physician recommends that you continue on your current medications as directed. Please refer to the Current Medication list given to you today.   Follow-Up: Your physician recommends that you schedule a follow-up appointment as needed.

## 2016-08-18 NOTE — Progress Notes (Signed)
Cardiology Office Note    Date:  08/19/2016   ID:  Drayden Lukas, DOB 12-04-1975, MRN [id number]  PCP:  Crist Infante, MD  Cardiologist:  Dr. Claiborne Billings  Chief Complaint  Patient presents with  . Follow-up    History of Present Illness:  Gabriel Hall is a 41 y.o. male with PMH of HTN and HLD. Patient recently seen in the ED for chest pain. Troponin negative 2. EKG showed nonspecific ST changes. Myoview testing obtained on 07/31/2016 was abnormal despite no chest pain. Patient was sent out for outpatient agnostic cardiac catheterization, he eventually returned on 08/05/2016 for cardiac catheterization. This showed EF > 65, normal LV systolic function, normal LVEDP, normal coronary arteries and a codominant system. Medical therapy was recommended for hypertension and hyperlipidemia.  Since his discharge, he denied any further chest discomfort or shortness of breath. He denies any lower extremity edema, orthopnea or paroxysmal nocturnal dyspnea. His blood pressure is normally very well controlled at home. As far as his cholesterol, recent lipid panel showed cholesterol 126, triglycerides 65, HDL 35, LDL 78. I encouraged increasing exercise. He is well-controlled on the current low dose Lipitor 10 mg daily. Given his young age, he likely can potentially come off of Lipitor at some point if he is able to increase his exercise. The fact that he does not have significant coronary atherosclerosis at this point is reassuring. I will leave the decision to the primary care provider. He can follow-up with cardiology only on an as needed basis at this point. 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   Past Medical History:  Diagnosis Date  . Dyslipidemia   . HTN (hypertension)   . Seasonal allergies     Past Surgical History:  Procedure Laterality Date  . LEFT HEART CATH AND CORONARY ANGIOGRAPHY N/A 08/05/2016   Procedure: Left Heart Cath and  Coronary Angiography;  Surgeon: Troy Sine, MD;  Location: [facility];  Service: Cardiovascular;  Laterality: N/A;    Current Medications: Outpatient Medications Prior to Visit  Medication Sig Dispense Refill  . acetaminophen (TYLENOL) 325 MG tablet Take 2 tablets (650 mg total) by mouth every 6 (six) hours as needed for mild pain or headache.    Marland Kitchen amLODipine (NORVASC) 5 MG tablet Take 5 mg by mouth daily.    Marland Kitchen aspirin 81 MG chewable tablet Chew 324 mg by mouth once.    Marland Kitchen atorvastatin (LIPITOR) 10 MG tablet Take 10 mg by mouth daily.    . cetirizine (ZYRTEC) 10 MG tablet Take 10 mg by mouth daily.      . irbesartan (AVAPRO) 150 MG tablet Take 150 mg by mouth daily.    . metoprolol tartrate (LOPRESSOR) 25 MG tablet Take 0.5 tablets (12.5 mg total) by mouth 2 (two) times daily. 30 tablet 5  . nitroGLYCERIN (NITROSTAT) 0.4 MG SL tablet Place 1 tablet (0.4 mg total) under the tongue every 5 (five) minutes x 3 doses as needed for chest pain. 25 tablet 2  . omeprazole (PRILOSEC) 40 MG capsule Take 40 mg by mouth daily.     No facility-administered medications prior to visit.      Allergies:   Patient has no known allergies.   Social History   Social History  . Marital status: Married    Spouse name: N/A  . Number of children: N/A  . Years of education: 81   Occupational History  . [employer]  Social History Main Topics  . Smoking status: Former Research scientist (life sciences)  . Smokeless tobacco: Never Used  . Alcohol use No  . Drug use: No  . Sexual activity: Not on file   Other Topics Concern  . Not on file   Social History Narrative  . No narrative on file     Family History:  The patient's family history includes Arthritis in his father; Cancer in his father; Hypertension in his mother.   ROS:   Please see the history of present illness.    ROS All other systems reviewed and are negative.   PHYSICAL EXAM:   VS:  BP 140/89   Pulse 66   Ht 5\' 8"  (1.727 m)   Wt 249  lb 6.4 oz (113.1 kg)   BMI 37.92 kg/m    GEN: Well nourished, well developed, in no acute distress  HEENT: normal  Neck: no JVD, carotid bruits, or masses Cardiac: RRR; no murmurs, rubs, or gallops,no edema  Respiratory:  clear to auscultation bilaterally, normal work of breathing GI: soft, nontender, nondistended, + BS MS: no deformity or atrophy  Skin: warm and dry, no rash Neuro:  Alert and Oriented x 3, Strength and sensation are intact Psych: euthymic mood, full affect  Wt Readings from Last 3 Encounters:  08/18/16 249 lb 6.4 oz (113.1 kg)  08/05/16 236 lb (107 kg)  07/31/16 245 lb (111.1 kg)      Studies/Labs Reviewed:   EKG:  EKG is not ordered today.    Recent Labs: 07/31/2016: Hemoglobin 14.0; Platelets 206 08/05/2016: BUN 12; Creatinine, Ser 1.01; Potassium 4.3; Sodium 139   Lipid Panel    Component Value Date/Time   CHOL 126 07/31/2016 1614   TRIG 65 07/31/2016 1614   HDL 35 (L) 07/31/2016 1614   CHOLHDL 3.6 07/31/2016 1614   VLDL 13 07/31/2016 1614   LDLCALC 78 07/31/2016 1614    Additional studies/ records that were reviewed today include:    Myoview 07/31/2016 IMPRESSION: 1. Moderate-sized region of inducible ischemia in the inferior wall.  2. Normal left ventricular wall motion.  3. Left ventricular ejection fraction 58%  4. Non invasive risk stratification*: Intermediate   Cath 08/05/2016 Conclusion     The left ventricular ejection fraction is greater than 65% by visual estimate.  The left ventricular systolic function is normal.  LV end diastolic pressure is normal.   Hyperdynamic LV function with an ejection fraction and 65% without focal segmental wall motion abnormalities.  Normal coronary arteries and a codominant system.  RECOMMENDATON: Medical therapy for hypertension and hyperlipidemia.  Weight loss and exercise is recommended.       ASSESSMENT:    1. Chest pain, unspecified type   2. Essential hypertension   3.  Hyperlipidemia, unspecified hyperlipidemia type      PLAN:  In order of problems listed above:  1. Chest pain: Chest pain has completely resolved, and recent cardiac catheterization showed clean coronaries. He does not need any further workup as this time. He can follow-up with cardiology on a as needed basis. Otherwise he should follow up with his primary care provider instead.  2. Hypertension: Blood pressure fairly controlled on current medication  3. Hyperlipidemia: He is currently on a very low-dose Lipitor. Last lipid panel recently showed low HDL, I encouraged diet and exercise. If he is able to increase exercise, may consider a trial of of statin medication especially in light of recent cardiac catheterization and the lack of any arterial atherosclerosis.  Medication Adjustments/Labs and Tests Ordered: Current medicines are reviewed at length with the patient today.  Concerns regarding medicines are outlined above.  Medication changes, Labs and Tests ordered today are listed in the Patient Instructions below. Patient Instructions  Medication Instructions: Your physician recommends that you continue on your current medications as directed. Please refer to the Current Medication list given to you today.   Follow-Up: Your physician recommends that you schedule a follow-up appointment as needed.       Hilbert Corrigan, [place]  08/19/2016 11:39 PM    [facility] [address], [place], [place]  [address] Phone: [phone]; Fax: [phone]

## 2016-08-19 ENCOUNTER — Encounter: Payer: Self-pay | Admitting: Physician Assistant

## 2016-11-24 DIAGNOSIS — H6980 Other specified disorders of Eustachian tube, unspecified ear: Secondary | ICD-10-CM | POA: Diagnosis not present

## 2016-11-24 DIAGNOSIS — R05 Cough: Secondary | ICD-10-CM | POA: Diagnosis not present

## 2017-10-04 DIAGNOSIS — Z125 Encounter for screening for malignant neoplasm of prostate: Secondary | ICD-10-CM | POA: Diagnosis not present

## 2017-10-04 DIAGNOSIS — R82998 Other abnormal findings in urine: Secondary | ICD-10-CM | POA: Diagnosis not present

## 2017-10-04 DIAGNOSIS — Z0000 Encounter for general adult medical examination without abnormal findings: Secondary | ICD-10-CM | POA: Diagnosis not present

## 2017-10-06 DIAGNOSIS — Z1389 Encounter for screening for other disorder: Secondary | ICD-10-CM | POA: Diagnosis not present

## 2017-10-06 DIAGNOSIS — H6980 Other specified disorders of Eustachian tube, unspecified ear: Secondary | ICD-10-CM | POA: Diagnosis not present

## 2017-10-06 DIAGNOSIS — E7849 Other hyperlipidemia: Secondary | ICD-10-CM | POA: Diagnosis not present

## 2017-10-06 DIAGNOSIS — Z23 Encounter for immunization: Secondary | ICD-10-CM | POA: Diagnosis not present

## 2017-10-06 DIAGNOSIS — Z0000 Encounter for general adult medical examination without abnormal findings: Secondary | ICD-10-CM | POA: Diagnosis not present

## 2017-10-25 DIAGNOSIS — M7742 Metatarsalgia, left foot: Secondary | ICD-10-CM | POA: Diagnosis not present

## 2017-10-25 DIAGNOSIS — M79672 Pain in left foot: Secondary | ICD-10-CM | POA: Diagnosis not present

## 2017-10-25 DIAGNOSIS — M79671 Pain in right foot: Secondary | ICD-10-CM | POA: Diagnosis not present

## 2017-12-23 DIAGNOSIS — R945 Abnormal results of liver function studies: Secondary | ICD-10-CM | POA: Diagnosis not present

## 2017-12-23 DIAGNOSIS — Z7689 Persons encountering health services in other specified circumstances: Secondary | ICD-10-CM | POA: Diagnosis not present

## 2017-12-27 ENCOUNTER — Other Ambulatory Visit: Payer: Self-pay | Admitting: Internal Medicine

## 2017-12-27 DIAGNOSIS — R945 Abnormal results of liver function studies: Secondary | ICD-10-CM

## 2017-12-27 DIAGNOSIS — R7989 Other specified abnormal findings of blood chemistry: Secondary | ICD-10-CM

## 2018-01-10 ENCOUNTER — Ambulatory Visit
Admission: RE | Admit: 2018-01-10 | Discharge: 2018-01-10 | Disposition: A | Discharge status: Home / Self Care | Payer: 59 | Source: Ambulatory Visit | Attending: Internal Medicine | Admitting: Internal Medicine

## 2018-01-10 DIAGNOSIS — R7989 Other specified abnormal findings of blood chemistry: Secondary | ICD-10-CM

## 2018-01-10 DIAGNOSIS — R945 Abnormal results of liver function studies: Secondary | ICD-10-CM

## 2018-08-19 IMAGING — CR DG CHEST 2V
2 series · 2 of 2 positions shown · non-contrast
Comparison: 05/01/2012 chest radiograph

CLINICAL DATA: 41 y/o  M; chest pain.

EXAM:
CHEST  2 VIEW

[chest pa]
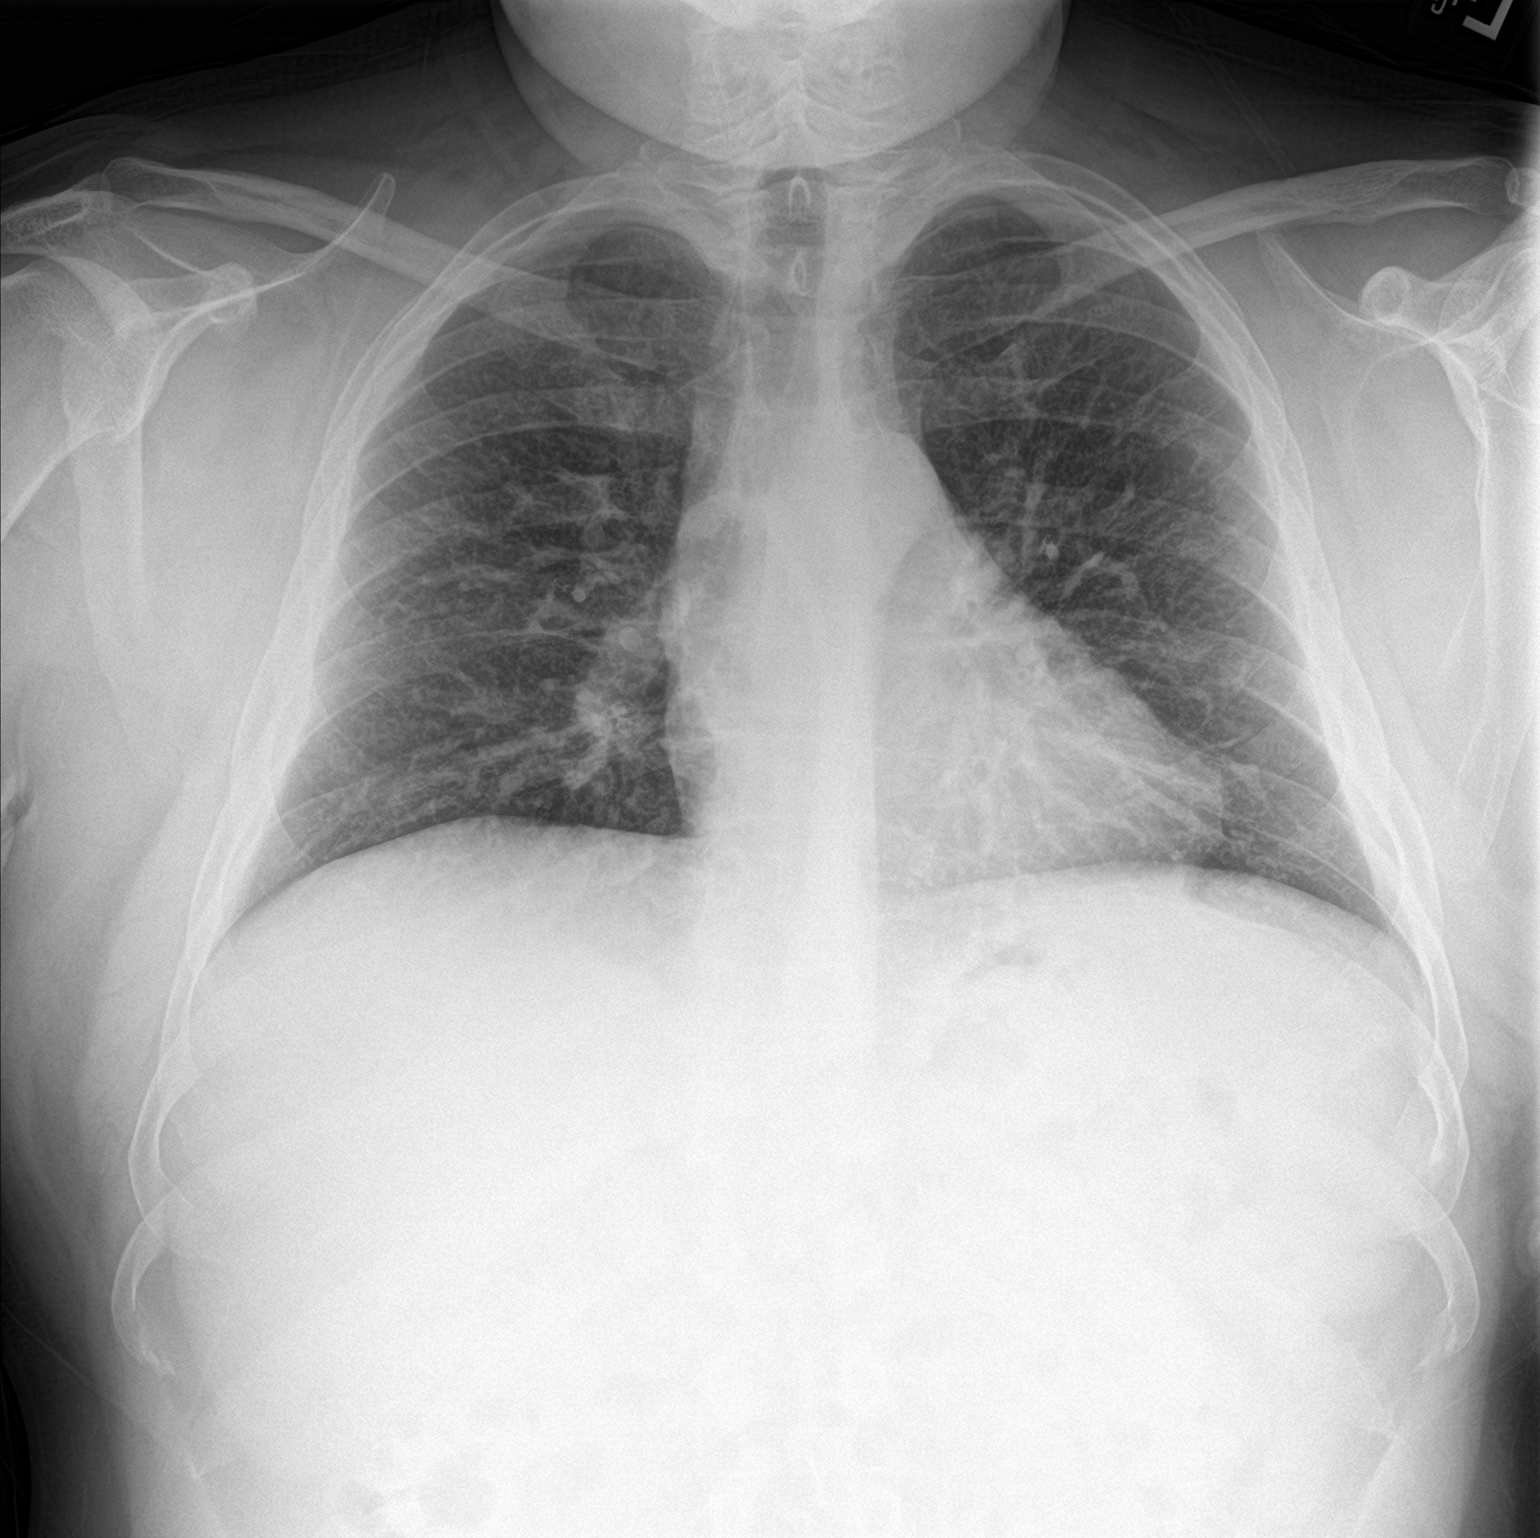

[chest lat]
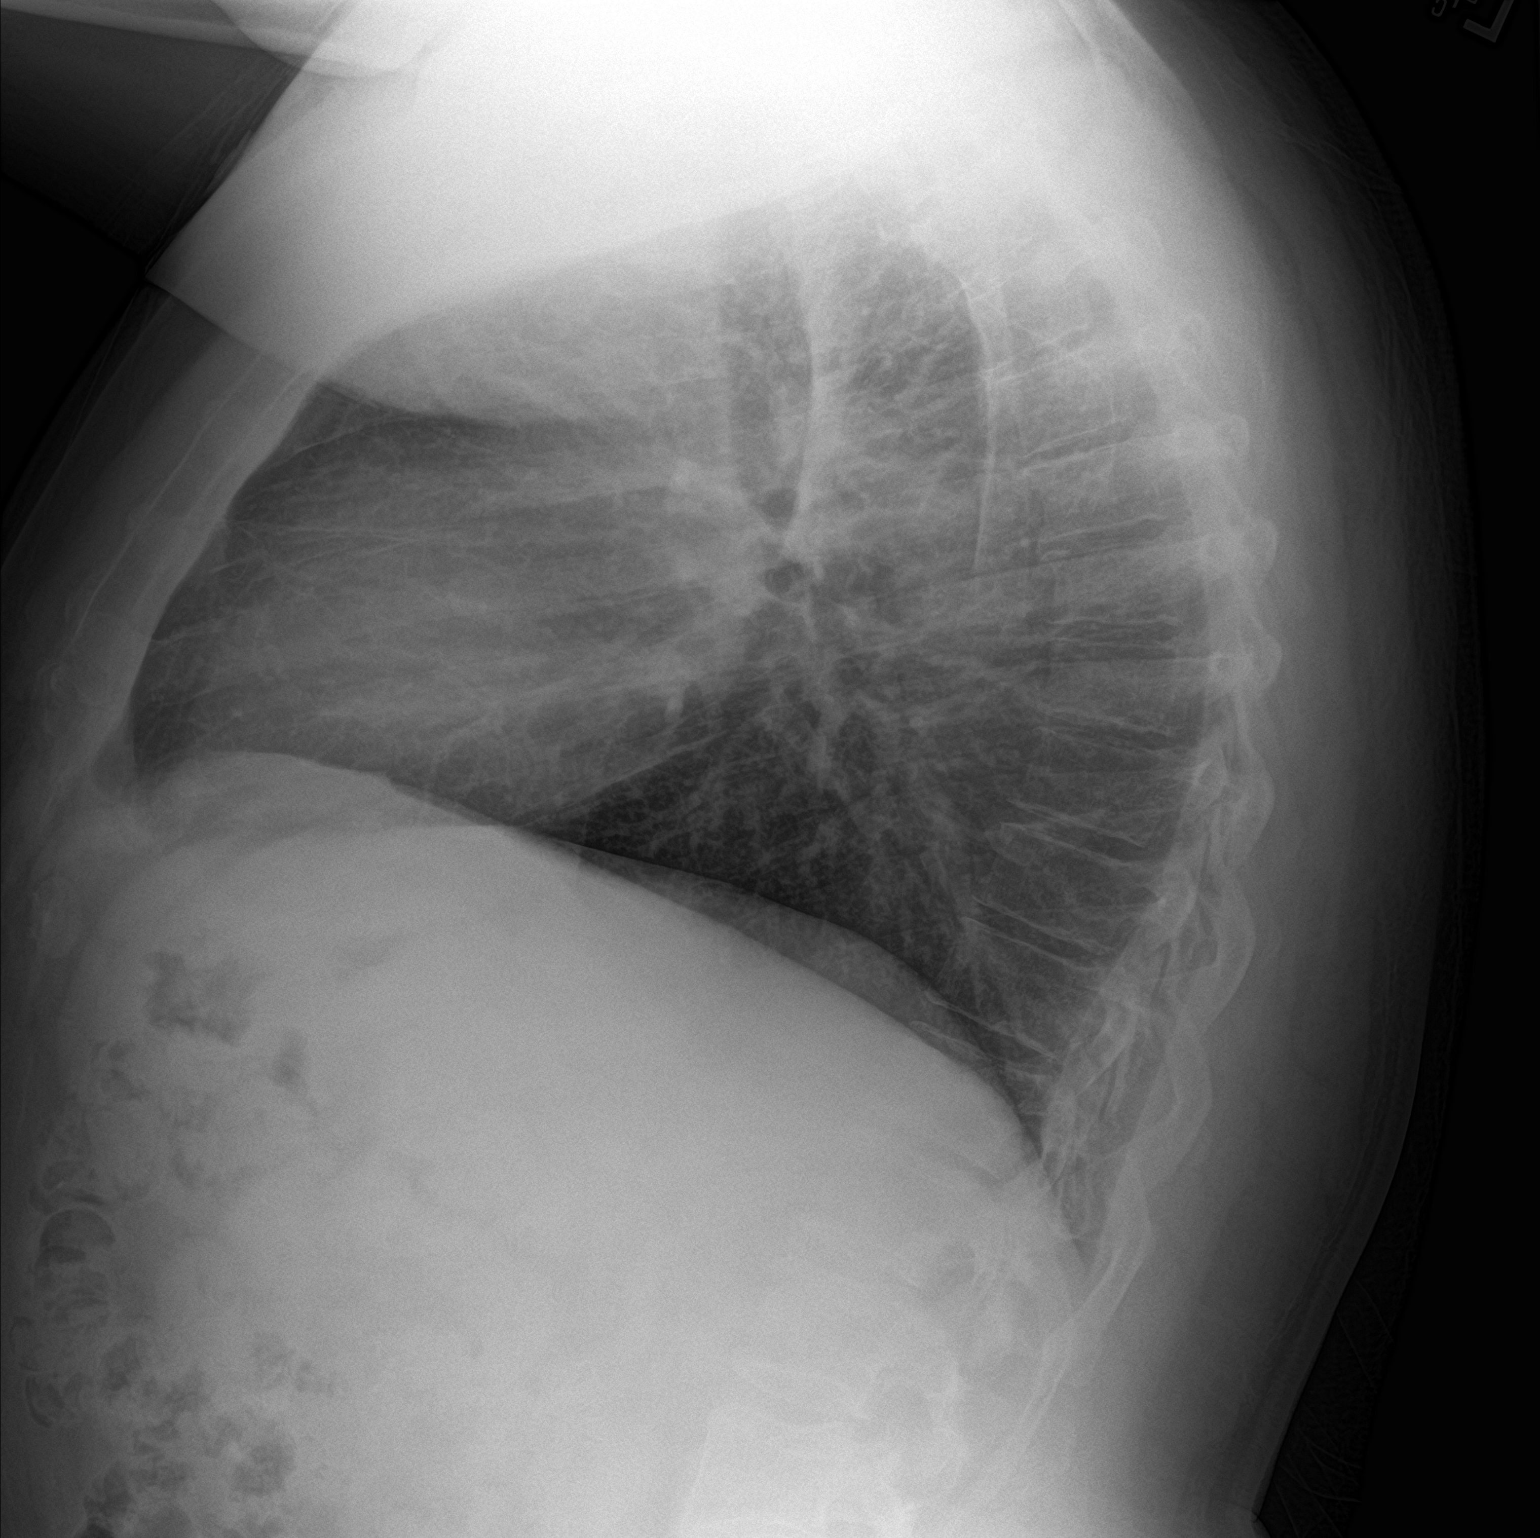

[2 of 2 positions shown; findings below may reference images not displayed]

FINDINGS: The heart size and mediastinal contours are within normal limits.
Pulmonary venous hypertension. Both lungs are clear. The visualized
skeletal structures are unremarkable.
IMPRESSION: Pulmonary venous hypertension. No consolidation, effusion, or
pneumothorax. Stable cardiac silhouette.

By: Ftth Takafi M.D.

## 2018-11-24 ENCOUNTER — Other Ambulatory Visit: Payer: Self-pay | Admitting: Internal Medicine

## 2018-11-24 DIAGNOSIS — M542 Cervicalgia: Secondary | ICD-10-CM

## 2018-11-29 ENCOUNTER — Other Ambulatory Visit: Payer: Self-pay | Admitting: Internal Medicine

## 2018-11-29 DIAGNOSIS — R070 Pain in throat: Secondary | ICD-10-CM

## 2018-11-29 DIAGNOSIS — M542 Cervicalgia: Secondary | ICD-10-CM

## 2018-12-07 ENCOUNTER — Ambulatory Visit
Admission: RE | Admit: 2018-12-07 | Discharge: 2018-12-07 | Disposition: A | Discharge status: Home / Self Care | Payer: 59 | Source: Ambulatory Visit | Attending: Internal Medicine | Admitting: Internal Medicine

## 2018-12-07 DIAGNOSIS — M542 Cervicalgia: Secondary | ICD-10-CM

## 2018-12-07 DIAGNOSIS — R070 Pain in throat: Secondary | ICD-10-CM

## 2018-12-09 ENCOUNTER — Other Ambulatory Visit: Payer: Self-pay | Admitting: Internal Medicine

## 2018-12-09 DIAGNOSIS — E041 Nontoxic single thyroid nodule: Secondary | ICD-10-CM

## 2018-12-14 ENCOUNTER — Ambulatory Visit
Admission: RE | Admit: 2018-12-14 | Discharge: 2018-12-14 | Disposition: A | Discharge status: Home / Self Care | Payer: 59 | Source: Ambulatory Visit | Attending: Internal Medicine | Admitting: Internal Medicine

## 2018-12-14 ENCOUNTER — Other Ambulatory Visit (HOSPITAL_COMMUNITY)
Admission: RE | Admit: 2018-12-14 | Discharge: 2018-12-14 | Disposition: A | Discharge status: Home / Self Care | Payer: 59 | Source: Ambulatory Visit | Attending: Internal Medicine | Admitting: Internal Medicine

## 2018-12-14 DIAGNOSIS — E041 Nontoxic single thyroid nodule: Secondary | ICD-10-CM | POA: Diagnosis not present

## 2018-12-15 LAB — CYTOLOGY - NON PAP

## 2019-01-10 ENCOUNTER — Encounter (HOSPITAL_COMMUNITY): Payer: Self-pay

## 2019-01-19 ENCOUNTER — Ambulatory Visit: Payer: Self-pay | Admitting: Surgery

## 2019-03-14 ENCOUNTER — Encounter (HOSPITAL_COMMUNITY): Payer: Self-pay | Admitting: Surgery

## 2019-03-14 DIAGNOSIS — D440 Neoplasm of uncertain behavior of thyroid gland: Secondary | ICD-10-CM | POA: Diagnosis present

## 2019-03-14 NOTE — H&P (Signed)
General Surgery [facility]  Gabriel Hall DOB: 1975-11-25 Married / Language: Cleophus Molt / Race: White Male   History of Present Illness  The patient is a 44 year old male who presents with a thyroid nodule.  CHIEF COMPLAINT: thyroid neoplasm of uncertain behavior  Patient is referred by Dr. Crist Infante for surgical evaluation and management of a newly diagnosed thyroid neoplasm of uncertain behavior. Patient first noted abnormality of the thyroid approximately 3 years ago during a screening examination by an ENT physician. Patient recently developed a "pop" in the anterior neck when he swallowed. Patient was referred for an ultrasound examination performed on December 07, 2018. This demonstrated a solitary thyroid nodule located in the isthmus measuring 2.7 x 2.3 x 1.6 cm. This was felt to be moderately suspicious and biopsy was recommended. On December 14, 2018 the patient underwent fine-needle aspiration biopsy. This showed a follicular neoplasm, Bethesda category IV, and was sent for [facility]. Molecular genetic testing resulted as suspicious, rendering a risk of malignancy of 50%. Patient is referred at this time for consideration for surgical resection for definitive diagnosis and management. TSH level is normal at 1.79. Patient has no prior history of thyroid disease. He has never been on thyroid medication. He has had no prior head or neck surgery. There is no family history of thyroid disease or thyroid neoplasm or other endocrine neoplasm. 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   Past Surgical History Oral Surgery   Diagnostic Studies History  Colonoscopy  never  Allergies No Known Drug Allergies  Medication History amLODIPine Besylate (5MG  Tablet, Oral) Active. Irbesartan (150MG  Tablet, Oral) Active. Omeprazole (40MG  Capsule DR, Oral) Active. Rosuvastatin Calcium (5MG  Tablet, Oral)  Active.  Social History Alcohol use  Occasional alcohol use. Caffeine use  Carbonated beverages, Tea. No drug use  Tobacco use  Former smoker.  Family History  Arthritis  Father. Cancer  Father. Hypertension  Mother.  Other Problems  Anxiety Disorder  Chest pain  Gastroesophageal Reflux Disease  High blood pressure    Review of Systems General Not Present- Appetite Loss, Chills, Fatigue, Fever, Night Sweats, Weight Gain and Weight Loss. Skin Not Present- Change in Wart/Mole, Dryness, Hives, Jaundice, New Lesions, Non-Healing Wounds, Rash and Ulcer. HEENT Present- Wears glasses/contact lenses. Not Present- Earache, Hearing Loss, Hoarseness, Nose Bleed, Oral Ulcers, Ringing in the Ears, Seasonal Allergies, Sinus Pain, Sore Throat, Visual Disturbances and Yellow Eyes. Respiratory Not Present- Bloody sputum, Chronic Cough, Difficulty Breathing, Snoring and Wheezing. Breast Not Present- Breast Mass, Breast Pain, Nipple Discharge and Skin Changes. Cardiovascular Not Present- Chest Pain, Difficulty Breathing Lying Down, Leg Cramps, Palpitations, Rapid Heart Rate, Shortness of Breath and Swelling of Extremities. Gastrointestinal Not Present- Abdominal Pain, Bloating, Bloody Stool, Change in Bowel Habits, Chronic diarrhea, Constipation, Difficulty Swallowing, Excessive gas, Gets full quickly at meals, Hemorrhoids, Indigestion, Nausea, Rectal Pain and Vomiting. Male Genitourinary Not Present- Blood in Urine, Change in Urinary Stream, Frequency, Impotence, Nocturia, Painful Urination, Urgency and Urine Leakage. Musculoskeletal Not Present- Back Pain, Joint Pain, Joint Stiffness, Muscle Pain, Muscle Weakness and Swelling of Extremities. Psychiatric Not Present- Anxiety, Bipolar, Change in Sleep Pattern, Depression, Fearful and Frequent crying. Endocrine Not Present- Cold Intolerance, Excessive Hunger, Hair Changes, Heat Intolerance, Hot flashes and New Diabetes. Hematology Not Present-  Blood Thinners, Easy Bruising, Excessive bleeding, Gland problems, HIV and Persistent Infections.  Vitals  Weight: 248 lb Height: 68in Body Surface Area: 2.24 m Body  Mass Index: 37.71 kg/m  Temp.: 97.43F(Tympanic)  Pulse: 102 (Regular)  BP: 140/90 (Sitting, Left Arm, Standard)  Physical Exam   GENERAL APPEARANCE Development: normal Nutritional status: normal Gross deformities: none  SKIN Rash, lesions, ulcers: none Induration, erythema: none Nodules: none palpable  EYES Conjunctiva and lids: normal Pupils: equal and reactive Iris: normal bilaterally  EARS, NOSE, MOUTH, THROAT External ears: no lesion or deformity External nose: no lesion or deformity Hearing: grossly normal Patient is wearing a mask.  NECK Symmetric: yes Trachea: midline Thyroid: In the midline at the level of the sternal notch is a palpable nodule which is more easily examined with swallowing maneuver. This measures at least 2 cm in size, is smooth, mobile, and nontender. Left and right thyroid lobes are without palpable abnormality. There is no associated lymphadenopathy.  CHEST Respiratory effort: normal Retraction or accessory muscle use: no Breath sounds: normal bilaterally Rales, rhonchi, wheeze: none  CARDIOVASCULAR Auscultation: regular rhythm, normal rate Murmurs: none Pulses: carotid and radial pulse 2+ palpable Lower extremity edema: none Lower extremity varicosities: none  MUSCULOSKELETAL Station and gait: normal Digits and nails: no clubbing or cyanosis Muscle strength: grossly normal all extremities Range of motion: grossly normal all extremities Deformity: none  LYMPHATIC Cervical: none palpable Supraclavicular: none palpable  PSYCHIATRIC Oriented to person, place, and time: yes Mood and affect: normal for situation Judgment and insight: appropriate for situation    Assessment & Plan  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 Patient is given written literature on thyroid surgery to review at home.  There is a 2.7 cm nodule in the thyroid isthmus. The remainder of the thyroid gland appears normal. Thyroid function is normal. The patient and I discussed either total thyroidectomy or isthmusectomy with the possibility of a second surgical procedure. We discussed the risk and benefits of surgery including the potential for recurrent laryngeal nerve injury and injury to parathyroid glands. We discussed the location and size of the surgical incision. We discussed the hospital stay to be anticipated. We discussed his postoperative recovery and return to work and activities. We discussed the potential need for radioactive iodine treatment. We discussed the potential need for lifelong thyroid hormone replacement. The patient understands and wishes to proceed with surgery in the near future. We will make arrangements at a time convenient for the patient.  The risks and benefits of the procedure have been discussed at length with the patient. The patient understands the proposed procedure, potential alternative treatments, and the course of recovery to be expected. All of the patient's questions have been answered at this time. The patient wishes to proceed with surgery.  Armandina Gemma, MD [facility] Office: [phone]

## 2019-03-17 NOTE — Patient Instructions (Addendum)
DUE TO COVID-19 ONLY ONE VISITOR IS ALLOWED TO COME WITH YOU AND STAY IN THE WAITING ROOM ONLY DURING PRE OP AND PROCEDURE DAY OF SURGERY. THE 1 VISITOR MAY VISIT WITH YOU AFTER SURGERY IN YOUR PRIVATE ROOM DURING VISITING HOURS ONLY!  YOU NEED TO HAVE A COVID 19 TEST ON: 03/23/19 @ 3:15 pm  , THIS TEST MUST BE DONE BEFORE SURGERY, COME  [place], [place] , [address].  ([place]) ONCE YOUR COVID TEST IS COMPLETED, PLEASE BEGIN THE QUARANTINE INSTRUCTIONS AS OUTLINED IN YOUR HANDOUT.                Gabriel Hall    Your procedure is scheduled on: 03/27/19   Report to [facility] Main  Entrance   Report to SHORT STAY at: 5:30 AM     Call this number if you have problems the morning of surgery [phone]    Remember: Do not eat food or drink liquids :After Midnight.   BRUSH YOUR TEETH MORNING OF SURGERY AND RINSE YOUR MOUTH OUT, NO CHEWING GUM CANDY OR MINTS.     Take these medicines the morning of surgery with A SIP OF WATER: amlodipine,cetirizine,metoprolol,omeprazole                                 You may not have any metal on your body including hair pins and              piercings  Do not wear jewelry,lotions, powders or perfumes, deodorant             Men may shave face and neck.   Do not bring valuables to the hospital. [place].  Contacts, dentures or bridgework may not be worn into surgery.  Leave suitcase in the car. After surgery it may be brought to your room.     Patients discharged the day of surgery will not be allowed to drive home. IF YOU ARE HAVING SURGERY AND GOING HOME THE SAME DAY, YOU MUST HAVE AN ADULT TO DRIVE YOU HOME AND BE WITH YOU FOR 24 HOURS. YOU MAY GO HOME BY TAXI OR UBER OR ORTHERWISE, BUT AN ADULT MUST ACCOMPANY YOU HOME AND STAY WITH YOU FOR 24 HOURS.  Name and phone number of your driver:  Special Instructions: N/A              Please read over the  following fact sheets you were given: _____________________________________________________________________             [facility] - Preparing for Surgery Before surgery, you can play an important role.  Because skin is not sterile, your skin needs to be as free of germs as possible.  You can reduce the number of germs on your skin by washing with CHG (chlorahexidine gluconate) soap before surgery.  CHG is an antiseptic cleaner which kills germs and bonds with the skin to continue killing germs even after washing. Please DO NOT use if you have an allergy to CHG or antibacterial soaps.  If your skin becomes reddened/irritated stop using the CHG and inform your nurse when you arrive at Short Stay. Do not shave (including legs and underarms) for at least 48 hours prior to the first CHG shower.  You may shave your face/neck. Please follow these instructions  carefully:  1.  Shower with CHG Soap the night before surgery and the  morning of Surgery.  2.  If you choose to wash your hair, wash your hair first as usual with your  normal  shampoo.  3.  After you shampoo, rinse your hair and body thoroughly to remove the  shampoo.                           4.  Use CHG as you would any other liquid soap.  You can apply chg directly  to the skin and wash                       Gently with a scrungie or clean washcloth.  5.  Apply the CHG Soap to your body ONLY FROM THE NECK DOWN.   Do not use on face/ open                           Wound or open sores. Avoid contact with eyes, ears mouth and genitals (private parts).                       Wash face,  Genitals (private parts) with your normal soap.             6.  Wash thoroughly, paying special attention to the area where your surgery  will be performed.  7.  Thoroughly rinse your body with warm water from the neck down.  8.  DO NOT shower/wash with your normal soap after using and rinsing off  the CHG Soap.                9.  Pat yourself dry with a clean  towel.            10.  Wear clean pajamas.            11.  Place clean sheets on your bed the night of your first shower and do not  sleep with pets. Day of Surgery : Do not apply any lotions/deodorants the morning of surgery.  Please wear clean clothes to the hospital/surgery center.  FAILURE TO FOLLOW THESE INSTRUCTIONS MAY RESULT IN THE CANCELLATION OF YOUR SURGERY PATIENT SIGNATURE_________________________________  NURSE SIGNATURE__________________________________  ________________________________________________________________________

## 2019-03-21 ENCOUNTER — Encounter (HOSPITAL_COMMUNITY): Payer: Self-pay

## 2019-03-21 ENCOUNTER — Encounter (HOSPITAL_COMMUNITY)
Admission: RE | Admit: 2019-03-21 | Discharge: 2019-03-21 | Disposition: A | Discharge status: Home / Self Care | Payer: 59 | Source: Ambulatory Visit | Attending: Surgery | Admitting: Surgery

## 2019-03-21 ENCOUNTER — Ambulatory Visit (HOSPITAL_COMMUNITY)
Admission: RE | Admit: 2019-03-21 | Discharge: 2019-03-21 | Disposition: A | Discharge status: Home / Self Care | Payer: 59 | Source: Ambulatory Visit | Attending: Anesthesiology | Admitting: Anesthesiology

## 2019-03-21 ENCOUNTER — Other Ambulatory Visit: Payer: Self-pay

## 2019-03-21 DIAGNOSIS — D497 Neoplasm of unspecified behavior of endocrine glands and other parts of nervous system: Secondary | ICD-10-CM | POA: Insufficient documentation

## 2019-03-21 HISTORY — DX: Angina pectoris, unspecified: I20.9

## 2019-03-21 LAB — BASIC METABOLIC PANEL
Anion gap: 11 (ref 5–15)
BUN: 15 mg/dL (ref 6–20)
CO2: 26 mmol/L (ref 22–32)
Calcium: 9.5 mg/dL (ref 8.9–10.3)
Chloride: 105 mmol/L (ref 98–111)
Creatinine, Ser: 1.06 mg/dL (ref 0.61–1.24)
GFR calc Af Amer: >60 mL/min (ref >60)
GFR calc non Af Amer: >60 mL/min (ref >60)
Glucose, Bld: 100 mg/dL — ABNORMAL HIGH (ref 70–99)
Potassium: 3.9 mmol/L (ref 3.5–5.1)
Sodium: 142 mmol/L (ref 135–145)

## 2019-03-21 LAB — CBC
HCT: 44.7 % (ref 39.0–52.0)
Hemoglobin: 15 g/dL (ref 13.0–17.0)
MCH: 28.5 pg (ref 26.0–34.0)
MCHC: 33.6 g/dL (ref 30.0–36.0)
MCV: 85 fL (ref 80.0–100.0)
Platelets: 226 10*3/uL (ref 150–400)
RBC: 5.26 MIL/uL (ref 4.22–5.81)
RDW: 13 % (ref 11.5–15.5)
WBC: 4.9 10*3/uL (ref 4.0–10.5)
nRBC: 0 % (ref 0.0–0.2)

## 2019-03-21 NOTE — Progress Notes (Signed)
PCP - Dr. Mable Paris Cardiologist -   Chest x-ray -  EKG -  Stress Test -  ECHO -  Cardiac Cath -   Sleep Study -  CPAP -   Fasting Blood Sugar -  Checks Blood Sugar _____ times a day  Blood Thinner Instructions: Aspirin Instructions: Last Dose:  Anesthesia review:   Patient denies shortness of breath, fever, cough and chest pain at PAT appointment   Patient verbalized understanding of instructions that were given to them at the PAT appointment. Patient was also instructed that they will need to review over the PAT instructions again at home before surgery.

## 2019-03-23 ENCOUNTER — Other Ambulatory Visit (HOSPITAL_COMMUNITY)
Admission: RE | Admit: 2019-03-23 | Discharge: 2019-03-23 | Disposition: A | Discharge status: Home / Self Care | Payer: 59 | Source: Ambulatory Visit | Attending: Surgery | Admitting: Surgery

## 2019-03-23 DIAGNOSIS — Z20822 Contact with and (suspected) exposure to covid-19: Secondary | ICD-10-CM | POA: Insufficient documentation

## 2019-03-23 DIAGNOSIS — Z01812 Encounter for preprocedural laboratory examination: Secondary | ICD-10-CM | POA: Diagnosis present

## 2019-03-23 LAB — SARS CORONAVIRUS 2 (TAT 6-24 HRS): SARS Coronavirus 2: NEGATIVE

## 2019-03-26 NOTE — Anesthesia Preprocedure Evaluation (Addendum)
Anesthesia Evaluation  Patient identified by MRN, date of birth, ID band Patient awake    Reviewed: Allergy & Precautions, NPO status , Patient's Chart, lab work & pertinent test results  Airway Mallampati: III  TM Distance: >3 FB Neck ROM: Full    Dental no notable dental hx. (+) Teeth Intact, Dental Advisory Given,    Pulmonary neg pulmonary ROS, former smoker,    Pulmonary exam normal breath sounds clear to auscultation       Cardiovascular hypertension, Pt. on medications Normal cardiovascular exam Rhythm:Regular Rate:Normal  HLD  Cath 2018 for chest pain; no intervention:  Hyperdynamic LV function with an ejection fraction and 65% without focal segmental wall motion abnormalities. Normal coronary arteries and a codominant system.   Neuro/Psych negative neurological ROS  negative psych ROS   GI/Hepatic Neg liver ROS, GERD  Controlled and Medicated,Took omeprazole last night    Endo/Other  Morbid obesityBMI 39  Renal/GU negative Renal ROS  negative genitourinary   Musculoskeletal negative musculoskeletal ROS (+)   Abdominal (+) + obese,   Peds  Hematology negative hematology ROS (+)   Anesthesia Other Findings Thyroid neoplasm   Reproductive/Obstetrics negative OB ROS                           Anesthesia Physical Anesthesia Plan  ASA: III  Anesthesia Plan: General   Post-op Pain Management:    Induction: Intravenous  PONV Risk Score and Plan: 3 and Ondansetron, Dexamethasone, Midazolam and Treatment may vary due to age or medical condition  Airway Management Planned: Oral ETT and Video Laryngoscope Planned  Additional Equipment: None  Intra-op Plan:   Post-operative Plan: Extubation in OR  Informed Consent: I have reviewed the patients History and Physical, chart, labs and discussed the procedure including the risks, benefits and alternatives for the proposed anesthesia  with the patient or authorized representative who has indicated his/her understanding and acceptance.     Dental advisory given  Plan Discussed with: CRNA  Anesthesia Plan Comments:        Anesthesia Quick Evaluation

## 2019-03-27 ENCOUNTER — Encounter (HOSPITAL_COMMUNITY)
Admission: RE | Disposition: A | Discharge status: Home / Self Care | Payer: Self-pay | Source: Home / Self Care | Attending: Surgery

## 2019-03-27 ENCOUNTER — Ambulatory Visit (HOSPITAL_COMMUNITY): Payer: 59 | Admitting: Anesthesiology

## 2019-03-27 ENCOUNTER — Ambulatory Visit (HOSPITAL_COMMUNITY)
Admission: RE | Admit: 2019-03-27 | Discharge: 2019-03-27 | Disposition: A | Discharge status: Home / Self Care | Payer: 59 | Attending: Surgery | Admitting: Surgery

## 2019-03-27 ENCOUNTER — Encounter (HOSPITAL_COMMUNITY): Payer: Self-pay | Admitting: Surgery

## 2019-03-27 ENCOUNTER — Other Ambulatory Visit: Payer: Self-pay

## 2019-03-27 ENCOUNTER — Ambulatory Visit (HOSPITAL_COMMUNITY): Payer: 59 | Admitting: Physician Assistant

## 2019-03-27 DIAGNOSIS — Z8249 Family history of ischemic heart disease and other diseases of the circulatory system: Secondary | ICD-10-CM | POA: Diagnosis not present

## 2019-03-27 DIAGNOSIS — I10 Essential (primary) hypertension: Secondary | ICD-10-CM | POA: Diagnosis not present

## 2019-03-27 DIAGNOSIS — D440 Neoplasm of uncertain behavior of thyroid gland: Secondary | ICD-10-CM

## 2019-03-27 DIAGNOSIS — K219 Gastro-esophageal reflux disease without esophagitis: Secondary | ICD-10-CM | POA: Insufficient documentation

## 2019-03-27 DIAGNOSIS — Z87891 Personal history of nicotine dependence: Secondary | ICD-10-CM | POA: Diagnosis not present

## 2019-03-27 DIAGNOSIS — Z79899 Other long term (current) drug therapy: Secondary | ICD-10-CM | POA: Diagnosis not present

## 2019-03-27 DIAGNOSIS — D497 Neoplasm of unspecified behavior of endocrine glands and other parts of nervous system: Secondary | ICD-10-CM | POA: Diagnosis present

## 2019-03-27 DIAGNOSIS — C73 Malignant neoplasm of thyroid gland: Secondary | ICD-10-CM | POA: Diagnosis not present

## 2019-03-27 DIAGNOSIS — E785 Hyperlipidemia, unspecified: Secondary | ICD-10-CM | POA: Insufficient documentation

## 2019-03-27 HISTORY — PX: THYROIDECTOMY: SHX17

## 2019-03-27 SURGERY — THYROIDECTOMY
Anesthesia: General

## 2019-03-27 MED ORDER — MIDAZOLAM HCL 2 MG/2ML IJ SOLN
INTRAMUSCULAR | Status: AC
  Filled 2019-03-27: qty 2

## 2019-03-27 MED ORDER — CEFAZOLIN SODIUM-DEXTROSE 2-4 GM/100ML-% IV SOLN
2.0000 g | INTRAVENOUS | Status: AC
  Administered 2019-03-27: 2 g via INTRAVENOUS
  Filled 2019-03-27: qty 100

## 2019-03-27 MED ORDER — PROPOFOL 10 MG/ML IV BOLUS
INTRAVENOUS | Status: AC
  Filled 2019-03-27: qty 40

## 2019-03-27 MED ORDER — PROPOFOL 10 MG/ML IV BOLUS
INTRAVENOUS | Status: DC | PRN
  Administered 2019-03-27: 200 mg via INTRAVENOUS

## 2019-03-27 MED ORDER — PHENYLEPHRINE HCL-NACL 10-0.9 MG/250ML-% IV SOLN
INTRAVENOUS | Status: DC | PRN
  Administered 2019-03-27: 20 ug/min via INTRAVENOUS

## 2019-03-27 MED ORDER — OXYCODONE HCL 5 MG PO TABS
5.0000 mg | ORAL_TABLET | Freq: Once | ORAL | Status: AC | PRN
  Administered 2019-03-27: 5 mg via ORAL

## 2019-03-27 MED ORDER — 0.9 % SODIUM CHLORIDE (POUR BTL) OPTIME
TOPICAL | Status: DC | PRN
  Administered 2019-03-27: 08:00:00 1000 mL

## 2019-03-27 MED ORDER — PROMETHAZINE HCL 25 MG/ML IJ SOLN: 6.2500 mg | INTRAMUSCULAR | Status: DC | PRN

## 2019-03-27 MED ORDER — ONDANSETRON HCL 4 MG/2ML IJ SOLN
INTRAMUSCULAR | Status: DC | PRN
  Administered 2019-03-27: 4 mg via INTRAVENOUS

## 2019-03-27 MED ORDER — HYDROMORPHONE HCL 1 MG/ML IJ SOLN
INTRAMUSCULAR | Status: AC
  Filled 2019-03-27: qty 1

## 2019-03-27 MED ORDER — ALBUTEROL SULFATE HFA 108 (90 BASE) MCG/ACT IN AERS
INHALATION_SPRAY | RESPIRATORY_TRACT | Status: DC | PRN
  Administered 2019-03-27 (×2): 2 via RESPIRATORY_TRACT

## 2019-03-27 MED ORDER — PHENYLEPHRINE HCL (PRESSORS) 10 MG/ML IV SOLN
INTRAVENOUS | Status: AC
  Filled 2019-03-27: qty 1

## 2019-03-27 MED ORDER — DEXAMETHASONE SODIUM PHOSPHATE 10 MG/ML IJ SOLN
INTRAMUSCULAR | Status: AC
  Filled 2019-03-27: qty 1

## 2019-03-27 MED ORDER — CHLORHEXIDINE GLUCONATE CLOTH 2 % EX PADS: 6.0000 | MEDICATED_PAD | Freq: Once | CUTANEOUS | Status: DC

## 2019-03-27 MED ORDER — PHENYLEPHRINE 40 MCG/ML (10ML) SYRINGE FOR IV PUSH (FOR BLOOD PRESSURE SUPPORT)
PREFILLED_SYRINGE | INTRAVENOUS | Status: AC
  Filled 2019-03-27: qty 10

## 2019-03-27 MED ORDER — FENTANYL CITRATE (PF) 250 MCG/5ML IJ SOLN
INTRAMUSCULAR | Status: DC | PRN
  Administered 2019-03-27: 100 ug via INTRAVENOUS
  Administered 2019-03-27: 50 ug via INTRAVENOUS

## 2019-03-27 MED ORDER — FENTANYL CITRATE (PF) 250 MCG/5ML IJ SOLN
INTRAMUSCULAR | Status: AC
  Filled 2019-03-27: qty 5

## 2019-03-27 MED ORDER — ONDANSETRON HCL 4 MG/2ML IJ SOLN
INTRAMUSCULAR | Status: AC
  Filled 2019-03-27: qty 2

## 2019-03-27 MED ORDER — MIDAZOLAM HCL 2 MG/2ML IJ SOLN
INTRAMUSCULAR | Status: DC | PRN
  Administered 2019-03-27: 2 mg via INTRAVENOUS

## 2019-03-27 MED ORDER — ROCURONIUM BROMIDE 10 MG/ML (PF) SYRINGE
PREFILLED_SYRINGE | INTRAVENOUS | Status: AC
  Filled 2019-03-27: qty 10

## 2019-03-27 MED ORDER — DEXAMETHASONE SODIUM PHOSPHATE 10 MG/ML IJ SOLN
INTRAMUSCULAR | Status: DC | PRN
  Administered 2019-03-27: 8 mg via INTRAVENOUS

## 2019-03-27 MED ORDER — LACTATED RINGERS IV SOLN
INTRAVENOUS | Status: DC
  Administered 2019-03-27: 1000 mL via INTRAVENOUS

## 2019-03-27 MED ORDER — MEPERIDINE HCL 50 MG/ML IJ SOLN: 6.2500 mg | INTRAMUSCULAR | Status: DC | PRN

## 2019-03-27 MED ORDER — DEXMEDETOMIDINE HCL 200 MCG/2ML IV SOLN
INTRAVENOUS | Status: DC | PRN
  Administered 2019-03-27: 8 ug via INTRAVENOUS
  Administered 2019-03-27: 12 ug via INTRAVENOUS

## 2019-03-27 MED ORDER — HYDROCODONE-ACETAMINOPHEN 5-325 MG PO TABS: 1.0000 | ORAL_TABLET | ORAL | 0 refills | Status: DC | PRN

## 2019-03-27 MED ORDER — ALBUTEROL SULFATE HFA 108 (90 BASE) MCG/ACT IN AERS
INHALATION_SPRAY | RESPIRATORY_TRACT | Status: AC
  Filled 2019-03-27: qty 6.7

## 2019-03-27 MED ORDER — HYDROMORPHONE HCL 1 MG/ML IJ SOLN
0.2500 mg | INTRAMUSCULAR | Status: DC | PRN
  Administered 2019-03-27 (×2): 0.5 mg via INTRAVENOUS

## 2019-03-27 MED ORDER — EPHEDRINE 5 MG/ML INJ
INTRAVENOUS | Status: AC
  Filled 2019-03-27: qty 10

## 2019-03-27 MED ORDER — SUGAMMADEX SODIUM 200 MG/2ML IV SOLN
INTRAVENOUS | Status: DC | PRN
  Administered 2019-03-27: 250 mg via INTRAVENOUS

## 2019-03-27 MED ORDER — OXYCODONE HCL 5 MG PO TABS
ORAL_TABLET | ORAL | Status: AC
  Filled 2019-03-27: qty 1

## 2019-03-27 MED ORDER — LIDOCAINE 2% (20 MG/ML) 5 ML SYRINGE
INTRAMUSCULAR | Status: DC | PRN
  Administered 2019-03-27: 40 mg via INTRAVENOUS

## 2019-03-27 MED ORDER — ACETAMINOPHEN 500 MG PO TABS
1000.0000 mg | ORAL_TABLET | Freq: Once | ORAL | Status: AC
  Administered 2019-03-27: 1000 mg via ORAL
  Filled 2019-03-27: qty 2

## 2019-03-27 MED ORDER — ROCURONIUM BROMIDE 10 MG/ML (PF) SYRINGE
PREFILLED_SYRINGE | INTRAVENOUS | Status: DC | PRN
  Administered 2019-03-27: 20 mg via INTRAVENOUS
  Administered 2019-03-27: 60 mg via INTRAVENOUS

## 2019-03-27 MED ORDER — PHENYLEPHRINE 40 MCG/ML (10ML) SYRINGE FOR IV PUSH (FOR BLOOD PRESSURE SUPPORT)
PREFILLED_SYRINGE | INTRAVENOUS | Status: DC | PRN
  Administered 2019-03-27: 120 ug via INTRAVENOUS
  Administered 2019-03-27: 40 ug via INTRAVENOUS
  Administered 2019-03-27 (×6): 80 ug via INTRAVENOUS

## 2019-03-27 MED ORDER — OXYCODONE HCL 5 MG/5ML PO SOLN: 5.0000 mg | Freq: Once | ORAL | Status: AC | PRN

## 2019-03-27 MED ORDER — KETOROLAC TROMETHAMINE 30 MG/ML IJ SOLN: 30.0000 mg | Freq: Once | INTRAMUSCULAR | Status: DC | PRN

## 2019-03-27 MED ORDER — LIDOCAINE 2% (20 MG/ML) 5 ML SYRINGE
INTRAMUSCULAR | Status: AC
  Filled 2019-03-27: qty 5

## 2019-03-27 SURGICAL SUPPLY — 28 items
ATTRACTOMAT 16X20 MAGNETIC DRP (DRAPES) ×2 IMPLANT
BLADE SURG 15 STRL LF DISP TIS (BLADE) ×1 IMPLANT
BLADE SURG 15 STRL SS (BLADE) ×1
CHLORAPREP W/TINT 26 (MISCELLANEOUS) ×2 IMPLANT
CLIP VESOCCLUDE MED 6/CT (CLIP) ×4 IMPLANT
CLIP VESOCCLUDE SM WIDE 6/CT (CLIP) ×4 IMPLANT
COVER SURGICAL LIGHT HANDLE (MISCELLANEOUS) ×2 IMPLANT
COVER WAND RF STERILE (DRAPES) ×2 IMPLANT
DERMABOND ADVANCED (GAUZE/BANDAGES/DRESSINGS) ×1
DERMABOND ADVANCED .7 DNX12 (GAUZE/BANDAGES/DRESSINGS) ×1 IMPLANT
DRAPE LAPAROTOMY T 98X78 PEDS (DRAPES) ×2 IMPLANT
ELECT REM PT RETURN 15FT ADLT (MISCELLANEOUS) ×2 IMPLANT
GAUZE 4X4 16PLY RFD (DISPOSABLE) ×2 IMPLANT
GLOVE SURG ORTHO 8.0 STRL STRW (GLOVE) ×2 IMPLANT
GOWN STRL REUS W/TWL XL LVL3 (GOWN DISPOSABLE) ×4 IMPLANT
HEMOSTAT SURGICEL 2X4 FIBR (HEMOSTASIS) ×2 IMPLANT
ILLUMINATOR WAVEGUIDE N/F (MISCELLANEOUS) ×2 IMPLANT
KIT BASIN OR (CUSTOM PROCEDURE TRAY) ×2 IMPLANT
KIT TURNOVER KIT A (KITS) IMPLANT
PACK BASIC VI WITH GOWN DISP (CUSTOM PROCEDURE TRAY) ×2 IMPLANT
PENCIL SMOKE EVACUATOR (MISCELLANEOUS) IMPLANT
SHEARS HARMONIC 9CM CVD (BLADE) ×2 IMPLANT
SUT MNCRL AB 4-0 PS2 18 (SUTURE) ×2 IMPLANT
SUT VIC AB 3-0 SH 18 (SUTURE) ×4 IMPLANT
SYR BULB IRRIGATION 50ML (SYRINGE) ×2 IMPLANT
TOWEL OR 17X26 10 PK STRL BLUE (TOWEL DISPOSABLE) ×2 IMPLANT
TOWEL OR NON WOVEN STRL DISP B (DISPOSABLE) ×2 IMPLANT
TUBING CONNECTING 10 (TUBING) ×2 IMPLANT

## 2019-03-27 NOTE — Interval H&P Note (Signed)
History and Physical Interval Note:  03/27/2019 7:04 AM  Gabriel Hall  has presented today for surgery, with the diagnosis of THYROID NEOPLASM OF UNCERTAIN BEHAVIOR.  The various methods of treatment have been discussed with the patient and family. After consideration of risks, benefits and other options for treatment, the patient has consented to    Procedure(s): THYROID ISTHUMUSECTOMY (N/A) as a surgical intervention.    The patient's history has been reviewed, patient examined, no change in status, stable for surgery.  I have reviewed the patient's chart and labs.  Questions were answered to the patient's satisfaction.    Armandina Gemma, MD [facility] Office: [place]

## 2019-03-27 NOTE — Transfer of Care (Signed)
Immediate Anesthesia Transfer of Care Note  Patient: Gabriel Hall  Procedure(s) Performed: THYROID ISTHUMUSECTOMY (N/A )  Patient Location: PACU  Anesthesia Type:General  Level of Consciousness: awake, drowsy and patient cooperative  Airway & Oxygen Therapy: Patient Spontanous Breathing and Patient connected to face mask oxygen  Post-op Assessment: Report given to RN and Post -op Vital signs reviewed and stable  Post vital signs: Reviewed and stable  Last Vitals:  Vitals Value Taken Time  BP 154/95 03/27/19 0907  Temp    Pulse 116 03/27/19 0910  Resp 25 03/27/19 0910  SpO2 94 % 03/27/19 0910  Vitals shown include unvalidated device data.  Last Pain:  Vitals:   03/27/19 0552  TempSrc:   PainSc: 0-No pain         Complications: No apparent anesthesia complications

## 2019-03-27 NOTE — Anesthesia Postprocedure Evaluation (Signed)
Anesthesia Post Note  Patient: Gabriel Hall  Procedure(s) Performed: THYROID ISTHUMUSECTOMY (N/A )     Patient location during evaluation: PACU Anesthesia Type: General Level of consciousness: awake and alert, oriented and patient cooperative Pain management: pain level controlled Vital Signs Assessment: post-procedure vital signs reviewed and stable Respiratory status: spontaneous breathing, nonlabored ventilation and respiratory function stable Cardiovascular status: blood pressure returned to baseline and stable Postop Assessment: no apparent nausea or vomiting Anesthetic complications: no    Last Vitals:  Vitals:   03/27/19 0907 03/27/19 1015  BP: (!) 154/95 124/83  Pulse: (!) 118   Resp: (!) 24   Temp: 36.7 C   SpO2: 95%     Last Pain:  Vitals:   03/27/19 1000  TempSrc:   PainSc: [place]

## 2019-03-27 NOTE — Anesthesia Procedure Notes (Signed)
Procedure Name: Intubation Date/Time: 03/27/2019 7:34 AM Performed by: Eben Burow, CRNA Pre-anesthesia Checklist: Patient identified, Emergency Drugs available, Suction available, Patient being monitored and Timeout performed Patient Re-evaluated:Patient Re-evaluated prior to induction Oxygen Delivery Method: Circle system utilized Preoxygenation: Pre-oxygenation with 100% oxygen Induction Type: IV induction Ventilation: Mask ventilation without difficulty Laryngoscope Size: Mac and 4 Grade View: Grade II Tube type: Oral Tube size: 7.5 mm Number of attempts: 1 Airway Equipment and Method: Stylet Placement Confirmation: ETT inserted through vocal cords under direct vision,  positive ETCO2 and breath sounds checked- equal and bilateral Secured at: 22 cm Tube secured with: Tape Dental Injury: Teeth and Oropharynx as per pre-operative assessment

## 2019-03-27 NOTE — Op Note (Signed)
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 Crist Infante for surgical evaluation and management of a newly diagnosed thyroid neoplasm of uncertain behavior. Patient first noted abnormality of the thyroid approximately 3 years ago during a screening examination by an ENT physician. Patient recently developed a "pop" in the anterior neck when he swallowed. Patient was referred for an ultrasound examination performed on December 07, 2018. This demonstrated a solitary thyroid nodule located in the isthmus measuring 2.7 x 2.3 x 1.6 cm. This was felt to be moderately suspicious and biopsy was recommended. On December 14, 2018 the patient underwent fine-needle aspiration biopsy. This showed a follicular neoplasm, Bethesda category IV, and was sent for [facility]. Molecular genetic testing resulted as suspicious, rendering a risk of malignancy of 50%. Patient is referred at this time for consideration for surgical resection for definitive diagnosis and management. TSH level is normal at 1.79. Patient has no prior history of thyroid disease. He has never been on thyroid medication. He has had no prior head or neck surgery. There is no family history of thyroid disease or thyroid neoplasm or other endocrine neoplasm. 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  Procedure:  The patient was seen in the pre-op holding area. The risks, benefits, complications, treatment options, and expected outcomes were previously discussed with the patient. The patient agreed with the proposed plan and has signed the informed consent form.  The patient  was brought to the operating room by the surgical team, identified as Karenann Cai and the procedure verified. A "time out" was completed and the above information confirmed.  Following induction of general endotracheal anesthesia, the patient is positioned and then prepped and draped in usual aseptic fashion.  After ascertaining that an adequate level of anesthesia had been achieved, a Kocher incision was made with a #15 blade.  Dissection was carried through the subcutaneous tissues and platysma.  Hemostasis was achieved with the electrocautery.  Skin flaps were elevated cephalad and caudad.  A Mahorner self-retaining retractors placed for exposure.  Strap muscles are incised in the midline.  Dissection is carried down to the thyroid isthmus.  There is a moderate sized pyramidal lobe present which is dissected out up to the level of the thyroid notch.  The pyramidal lobe is excised with the isthmus.  In the isthmus is a dominant nodule measuring at least 2.5 cm in greatest dimension.  Isthmus is dissected out.  Thyroid tissue was mobilized off the underlying trachea.  The nodule is slightly located more to the left side of the isthmus.  Therefore the right side of the isthmus is fully mobilized and divided with the harmonic scalpel.  The isthmus is then elevated off of the trachea using the electrocautery for hemostasis.  Inferior venous tributaries are divided with the harmonic scalpel.  The nodule is retracted towards the right and the isthmus is dissected out to the left of midline.  The isthmus is transected at its junction with the left thyroid lobe using the harmonic scalpel.  The entire specimen is removed.  The isthmus margins are marked with a single suture  on the left and a double suture on the right.  The specimen is submitted to pathology for review.  Neck is irrigated with warm saline.  Good hemostasis is noted throughout the operative field and particularly along the cut surfaces of the  thyroid lobes.  Fibrillar is placed in the operative field.  Strap muscles are closed in the midline with interrupted 3-0 Vicryl sutures.  Platysma was closed with interrupted 3-0 Vicryl sutures.  Skin is closed with a running 4-0 Monocryl subcuticular suture.  Wound is washed and dried and Dermabond is applied as dressing.  Patient is awakened from anesthesia and brought to the recovery room.  The patient tolerated the procedure well.   Armandina Gemma, MD [facility] Office: [phone]

## 2019-03-31 LAB — SURGICAL PATHOLOGY

## 2019-10-11 ENCOUNTER — Other Ambulatory Visit: Payer: Self-pay | Admitting: Surgery

## 2019-10-11 DIAGNOSIS — E041 Nontoxic single thyroid nodule: Secondary | ICD-10-CM

## 2019-10-11 DIAGNOSIS — D440 Neoplasm of uncertain behavior of thyroid gland: Secondary | ICD-10-CM

## 2019-10-11 DIAGNOSIS — C73 Malignant neoplasm of thyroid gland: Secondary | ICD-10-CM

## 2019-10-11 DIAGNOSIS — E890 Postprocedural hypothyroidism: Secondary | ICD-10-CM

## 2019-10-19 ENCOUNTER — Ambulatory Visit
Admission: RE | Admit: 2019-10-19 | Discharge: 2019-10-19 | Disposition: A | Discharge status: Home / Self Care | Payer: 59 | Source: Ambulatory Visit | Attending: Surgery | Admitting: Surgery

## 2019-10-19 ENCOUNTER — Other Ambulatory Visit: Payer: 59

## 2019-10-19 DIAGNOSIS — E041 Nontoxic single thyroid nodule: Secondary | ICD-10-CM

## 2019-10-19 DIAGNOSIS — D440 Neoplasm of uncertain behavior of thyroid gland: Secondary | ICD-10-CM

## 2019-10-19 DIAGNOSIS — E890 Postprocedural hypothyroidism: Secondary | ICD-10-CM

## 2019-10-19 DIAGNOSIS — C73 Malignant neoplasm of thyroid gland: Secondary | ICD-10-CM

## 2022-02-19 ENCOUNTER — Encounter: Payer: Self-pay | Admitting: Gastroenterology

## 2022-03-16 ENCOUNTER — Encounter: Payer: Self-pay | Admitting: Gastroenterology

## 2022-03-16 ENCOUNTER — Ambulatory Visit (AMBULATORY_SURGERY_CENTER): Payer: 59

## 2022-03-16 VITALS — Ht 68.0 in | Wt 265.0 lb

## 2022-03-16 DIAGNOSIS — Z1211 Encounter for screening for malignant neoplasm of colon: Secondary | ICD-10-CM

## 2022-03-16 MED ORDER — NA SULFATE-K SULFATE-MG SULF 17.5-3.13-1.6 GM/177ML PO SOLN: 1.0000 | Freq: Once | ORAL | 0 refills | Status: AC

## 2022-03-16 NOTE — Progress Notes (Signed)
No egg or soy allergy known to patient  No issues known to pt with past sedation with any surgeries or procedures Patient denies ever being told they had issues or difficulty with intubation  No FH of Malignant Hyperthermia Pt is not on diet pills Pt is not on home 02  Pt is not on blood thinners  Pt denies issues with constipation  No A fib or A flutter Have any cardiac testing pending--NO Pt instructed to use Singlecare.com or GoodRx for a price reduction on prep   

## 2022-04-12 ENCOUNTER — Encounter: Payer: Self-pay | Admitting: Certified Registered Nurse Anesthetist

## 2022-04-13 ENCOUNTER — Ambulatory Visit (AMBULATORY_SURGERY_CENTER): Payer: 59 | Admitting: Gastroenterology

## 2022-04-13 ENCOUNTER — Encounter: Payer: Self-pay | Admitting: Gastroenterology

## 2022-04-13 VITALS — BP 110/74 | HR 84 | Temp 98.9°F | Resp 15 | Ht 68.0 in | Wt 265.0 lb

## 2022-04-13 DIAGNOSIS — Z1211 Encounter for screening for malignant neoplasm of colon: Secondary | ICD-10-CM

## 2022-04-13 MED ORDER — SODIUM CHLORIDE 0.9 % IV SOLN: 500.0000 mL | Freq: Once | INTRAVENOUS | Status: DC

## 2022-04-13 NOTE — Patient Instructions (Signed)
YOU HAD AN ENDOSCOPIC PROCEDURE TODAY AT THE [facility]:   Refer to the procedure report that was given to you for any specific questions about what was found during the examination.  If the procedure report does not answer your questions, please call your gastroenterologist to clarify.  If you requested that your care partner not be given the details of your procedure findings, then the procedure report has been included in a sealed envelope for you to review at your convenience later.  YOU SHOULD EXPECT: Some feelings of bloating in the abdomen. Passage of more gas than usual.  Walking can help get rid of the air that was put into your GI tract during the procedure and reduce the bloating. If you had a lower endoscopy (such as a colonoscopy or flexible sigmoidoscopy) you may notice spotting of blood in your stool or on the toilet paper. If you underwent a bowel prep for your procedure, you may not have a normal bowel movement for a few days.  Please Note:  You might notice some irritation and congestion in your nose or some drainage.  This is from the oxygen used during your procedure.  There is no need for concern and it should clear up in a day or so.  SYMPTOMS TO REPORT IMMEDIATELY:  Following lower endoscopy (colonoscopy or flexible sigmoidoscopy):  Excessive amounts of blood in the stool  Significant tenderness or worsening of abdominal pains  Swelling of the abdomen that is new, acute  Fever of 100F or higher  For urgent or emergent issues, a gastroenterologist can be reached at any hour by calling [phone]. Do not use MyChart messaging for urgent concerns.    DIET:  We do recommend a small meal at first, but then you may proceed to your regular diet.  Drink plenty of fluids but you should avoid alcoholic beverages for 24 hours.  ACTIVITY:  You should plan to take it easy for the rest of today and you should NOT DRIVE or use heavy machinery until tomorrow (because of  the sedation medicines used during the test).    FOLLOW UP: Our staff will call the number listed on your records the next business day following your procedure.  We will call around 7:15- 8:00 am to check on you and address any questions or concerns that you may have regarding the information given to you following your procedure. If we do not reach you, we will leave a message.     If any biopsies were taken you will be contacted by phone or by letter within the next 1-3 weeks.  Please call us at [phone] if you have not heard about the biopsies in 3 weeks.    SIGNATURES/CONFIDENTIALITY: You and/or your care partner have signed paperwork which will be entered into your electronic medical record.  These signatures attest to the fact that that the information above on your After Visit Summary has been reviewed and is understood.  Full responsibility of the confidentiality of this discharge information lies with you and/or your care-partner.  

## 2022-04-13 NOTE — Progress Notes (Signed)
Report given to PACU, vss 

## 2022-04-13 NOTE — Progress Notes (Signed)
History and Physical:  This patient presents for endoscopic testing for: Encounter Diagnosis  Name Primary?   Special screening for malignant neoplasms, colon Yes    Average risk for colorectal cancer.  First screening exam. Patient denies chronic abdominal pain, rectal bleeding, constipation or diarrhea.   Patient is otherwise without complaints or active issues today.   Past Medical History: Past Medical History:  Diagnosis Date   Allergy    Anginal pain    Cancer    Dyslipidemia    GERD (gastroesophageal reflux disease)    HTN (hypertension)    Hyperlipidemia    Seasonal allergies    Thyroid disease      Past Surgical History: Past Surgical History:  Procedure Laterality Date   LEFT HEART CATH AND CORONARY ANGIOGRAPHY N/A 08/05/2016   Procedure: Left Heart Cath and Coronary Angiography;  Surgeon: Lennette Bihari, MD;  Location: [facility] INVASIVE CV LAB;  Service: Cardiovascular;  Laterality: N/A;   THYROIDECTOMY N/A 03/27/2019   Procedure: THYROID ISTHUMUSECTOMY;  Surgeon: Darnell Level, MD;  Location: [facility] ORS;  Service: General;  Laterality: N/A;    Allergies: Allergies  Allergen Reactions   Latex     Outpatient Meds: Current Outpatient Medications  Medication Sig Dispense Refill   amLODipine (NORVASC) 5 MG tablet Take 5 mg by mouth daily.     cetirizine (ZYRTEC) 10 MG tablet Take 10 mg by mouth daily.       Choline Dihydrogen Citrate (CHOLINE CITRATE PO) Take by mouth.     ergocalciferol (VITAMIN D2) 1.25 MG (50000 UT) capsule Take 50,000 Units by mouth once a week.     irbesartan (AVAPRO) 150 MG tablet Take 150 mg by mouth daily.     omeprazole (PRILOSEC) 40 MG capsule Take 40 mg by mouth daily.     rosuvastatin (CRESTOR) 5 MG tablet Take 5 mg by mouth daily.     acetaminophen (TYLENOL) 325 MG tablet Take 2 tablets (650 mg total) by mouth every 6 (six) hours as needed for mild pain or headache.     Current Facility-Administered Medications  Medication Dose Route  Frequency Provider Last Rate Last Admin   0.9 %  sodium chloride infusion  500 mL Intravenous Once Charlie Pitter III, MD          ___________________________________________________________________ Objective   Exam:  BP 117/81   Pulse 91   Temp 98.9 F (37.2 C) (Temporal)   Ht 5\' 8"  (1.727 m)   Wt 265 lb (120.2 kg)   SpO2 95%   BMI 40.29 kg/m   CV: regular , S1/S2 Resp: clear to auscultation bilaterally, normal RR and effort noted GI: soft, no tenderness, with active bowel sounds.   Assessment: Encounter Diagnosis  Name Primary?   Special screening for malignant neoplasms, colon Yes     Plan: Colonoscopy  The benefits and risks of the planned procedure were described in detail with the patient or (when appropriate) their health care proxy.  Risks were outlined as including, but not limited to, bleeding, infection, perforation, adverse medication reaction leading to cardiac or pulmonary decompensation, pancreatitis (if ERCP).  The limitation of incomplete mucosal visualization was also discussed.  No guarantees or warranties were given.    The patient is appropriate for an endoscopic procedure in the ambulatory setting.   - Amada Jupiter, MD

## 2022-04-13 NOTE — Op Note (Signed)
[facility] Patient Name: Gabriel Hall Procedure Date: 04/13/2022 9:32 AM MRN: [id number] Endoscopist: Sherilyn Cooter L. Myrtie Neither , MD, [id number] Age: 47 Referring MD:  Date of Birth: 1975-09-28 Gender: Male Account #: [id number] Procedure:                Colonoscopy Indications:              Screening for colorectal malignant neoplasm, This                            is the patient's first colonoscopy Medicines:                Monitored Anesthesia Care Procedure:                Pre-Anesthesia Assessment:                           - Prior to the procedure, a History and Physical                            was performed, and patient medications and                            allergies were reviewed. The patient's tolerance of                            previous anesthesia was also reviewed. The risks                            and benefits of the procedure and the sedation                            options and risks were discussed with the patient.                            All questions were answered, and informed consent                            was obtained. Prior Anticoagulants: The patient has                            taken no anticoagulant or antiplatelet agents. ASA                            Grade Assessment: III - A patient with severe                            systemic disease. After reviewing the risks and                            benefits, the patient was deemed in satisfactory                            condition to undergo the procedure.  After obtaining informed consent, the colonoscope                            was passed under direct vision. Throughout the                            procedure, the patient's blood pressure, pulse, and                            oxygen saturations were monitored continuously. The                            CF [id number] #[id number] was introduced through the anus                            and advanced to  the the cecum, identified by                            appendiceal orifice and ileocecal valve. The                            colonoscopy was performed without difficulty. The                            patient tolerated the procedure well. The quality                            of the bowel preparation was excellent. The                            ileocecal valve, appendiceal orifice, and rectum                            were photographed. Scope In: 9:42:24 AM Scope Out: 9:54:48 AM Scope Withdrawal Time: 0 hours 9 minutes 34 seconds  Total Procedure Duration: 0 hours 12 minutes 24 seconds  Findings:                 The perianal and digital rectal examinations were                            normal.                           Repeat examination of right colon under NBI                            performed.                           The entire examined colon appeared normal on direct                            and retroflexion views. Complications:            No immediate complications. Estimated Blood Loss:  Estimated blood loss: none. Impression:               - The entire examined colon is normal on direct and                            retroflexion views.                           - No specimens collected.                           - The GI Genius (intelligent endoscopy module),                            computer-aided polyp detection system powered by AI                            was utilized to detect colorectal polyps through                            enhanced visualization during colonoscopy. Recommendation:           - Patient has a contact number available for                            emergencies. The signs and symptoms of potential                            delayed complications were discussed with the                            patient. Return to normal activities tomorrow.                            Written discharge instructions were provided to the                             patient.                           - Resume previous diet.                           - Continue present medications.                           - Repeat colonoscopy in 10 years for screening                            purposes. Tianah Lonardo L. Myrtie Neither, MD 04/13/2022 9:57:19 AM This report has been signed electronically.

## 2022-04-13 NOTE — Progress Notes (Signed)
Pt's states no medical or surgical changes since previsit or office visit. 

## 2022-04-14 ENCOUNTER — Telehealth: Payer: Self-pay

## 2022-04-14 NOTE — Telephone Encounter (Signed)
  Follow up Call-     04/13/2022    8:39 AM  Call back number  Post procedure Call Back phone  # [phone]  Permission to leave phone message Yes     Patient questions:  Do you have a fever, pain , or abdominal swelling? No. Pain Score  0 *  Have you tolerated food without any problems? Yes.    Have you been able to return to your normal activities? Yes.    Do you have any questions about your discharge instructions: Diet   No. Medications  No. Follow up visit  No.  Do you have questions or concerns about your Care? No.  Actions: * If pain score is 4 or above: No action needed, pain <4.

## 2022-10-23 ENCOUNTER — Other Ambulatory Visit: Payer: Self-pay | Admitting: Surgery

## 2022-10-23 DIAGNOSIS — Z9889 Other specified postprocedural states: Secondary | ICD-10-CM

## 2022-10-23 DIAGNOSIS — Z85850 Personal history of malignant neoplasm of thyroid: Secondary | ICD-10-CM

## 2022-10-28 ENCOUNTER — Ambulatory Visit
Admission: RE | Admit: 2022-10-28 | Discharge: 2022-10-28 | Disposition: A | Discharge status: Home / Self Care | Payer: 59 | Source: Ambulatory Visit | Attending: Surgery | Admitting: Surgery

## 2022-10-28 DIAGNOSIS — Z85850 Personal history of malignant neoplasm of thyroid: Secondary | ICD-10-CM

## 2022-10-28 DIAGNOSIS — Z9889 Other specified postprocedural states: Secondary | ICD-10-CM

## 2022-11-10 NOTE — Progress Notes (Signed)
USN shows no sign of recurrent thyroid disease - no sign of cancer.  Nodule behind left lobe is small at 6 mm - doubt this is parathyroid adenoma, but will order labs just to be safe.  Tresa Endo - please send patient to [facility] office for calcium level and PTH level.  I will contact patient with results when available.  Darnell Level, MD [facility] practice Office: [phone]
